# Patient Record
Sex: Female | Born: 1995 | Race: White | Hispanic: No | Marital: Single | Smoking: Never smoker
Health system: Southern US, Community
[De-identification: ages and names within clinical notes are randomized; demographics above are authoritative.]

## PROBLEM LIST (undated history)

## (undated) HISTORY — PX: BACK SURGERY: SHX140

---

## 2020-05-01 ENCOUNTER — Ambulatory Visit (INDEPENDENT_AMBULATORY_CARE_PROVIDER_SITE_OTHER): Payer: BC Managed Care – PPO | Admitting: Medical-Surgical

## 2020-05-01 ENCOUNTER — Other Ambulatory Visit: Payer: Self-pay

## 2020-05-01 ENCOUNTER — Encounter: Payer: Self-pay | Admitting: Medical-Surgical

## 2020-05-01 VITALS — BP 118/77 | HR 74 | Temp 97.5°F | Ht 65.5 in | Wt 234.0 lb

## 2020-05-01 DIAGNOSIS — F418 Other specified anxiety disorders: Secondary | ICD-10-CM | POA: Diagnosis not present

## 2020-05-01 DIAGNOSIS — R234 Changes in skin texture: Secondary | ICD-10-CM

## 2020-05-01 DIAGNOSIS — R358 Other polyuria: Secondary | ICD-10-CM

## 2020-05-01 DIAGNOSIS — G479 Sleep disorder, unspecified: Secondary | ICD-10-CM

## 2020-05-01 DIAGNOSIS — R631 Polydipsia: Secondary | ICD-10-CM

## 2020-05-01 DIAGNOSIS — R5383 Other fatigue: Secondary | ICD-10-CM

## 2020-05-01 DIAGNOSIS — N6325 Unspecified lump in the left breast, overlapping quadrants: Secondary | ICD-10-CM

## 2020-05-01 DIAGNOSIS — R3589 Other polyuria: Secondary | ICD-10-CM

## 2020-05-01 MED ORDER — QUETIAPINE FUMARATE 50 MG PO TABS
50.0000 mg | ORAL_TABLET | Freq: Every day | ORAL | 1 refills | Status: DC
Start: 2020-05-01 — End: 2020-05-21

## 2020-05-01 NOTE — Progress Notes (Signed)
New Patient Office Visit  Subjective:  Patient ID: Sheryl [name], female    DOB: 17-May-1996  Age: 24 y.o. MRN: [id number]  CC:  Chief Complaint  Patient presents with  . Establish Care    HPI Sheryl [name] presents to establish care.  Depression/anxiety-reports she has both depression and anxiety.  It comes in waves with no specific trigger for either.  She has been more aware of her symptoms since she started school for physical therapy.  She goes from highs to lows, changing approximately every 3 to 4 weeks.  She does note that this seems to be somewhat related to her menstrual cycles as she will have a "high" the week prior to starting her period, changing to more depressed the day before she starts bleeding.  During her high periods, she experiences improved self-esteem, increased talkativeness, increased goal-directed activities, decreased sleep, and racing thoughts.  During her low periods, she experiences fatigue, difficulty concentrating, difficulty following through on activities, and decreased interest in activities.  She did do some counseling in 2015 through 2018 and again last fall.  She feels that counseling was helpful and taught her coping skills.  She did take medication for treatment for about 6 months in 2015 but she is not sure which one it was.  That medication made her more sleepy than anything.  Denies SI/HI.  She is very interested in getting started on medication that will help her stabilize her mood as well as improve her sleep quality.  Breast concerns-she has a very strong family history of breast cancer in a woman in both sides of her family.  She is concerned about breast cancer prevention and breast changes.  She notes that she does have lumps that occasionally develop in her breasts which then popped through the skin, becoming a sore before scabbing over and healing.  She notes that she does do self breast exams monthly and she tries to do them the week after her  period.  Obesity-over the past 18 months she has lost 50 pounds. She is very cognizant that obesity will cause health problems and she is very worried about diabetes as this is also strong in her family history.  She endorses polydipsia and polyuria.  Notes that she does feel hungry frequently but is unable to eat much food.  Notes that over the past few months her weight has stayed the same but her clothes are starting to fit her better since she is focusing on eating healthier and getting more activity.  History reviewed. No pertinent past medical history.  Past Surgical History:  Procedure Laterality Date  . BACK SURGERY     L4-L5 fusion    Family History  Problem Relation Age of Onset  . Diabetes Mother   . Breast cancer Maternal Grandmother   . Hypertension Maternal Grandfather   . Leukemia Maternal Grandfather   . Breast cancer Paternal Grandmother   . Diabetes Paternal Grandfather     Social History   Socioeconomic History  . Marital status: Married    Spouse name: Not on file  . Number of children: Not on file  . Years of education: Not on file  . Highest education level: Not on file  Occupational History  . Not on file  Tobacco Use  . Smoking status: Never Smoker  . Smokeless tobacco: Never Used  Vaping Use  . Vaping Use: Never used  Substance and Sexual Activity  . Alcohol use: Yes    Comment: Occasionally  .  Drug use: Never  . Sexual activity: Never  Other Topics Concern  . Not on file  Social History Narrative  . Not on file   Social Determinants of Health   Financial Resource Strain:   . Difficulty of Paying Living Expenses:   Food Insecurity:   . Worried About Programme researcher, broadcasting/film/video in the Last Year:   . Barista in the Last Year:   Transportation Needs:   . Freight forwarder (Medical):   Marland Kitchen Lack of Transportation (Non-Medical):   Physical Activity:   . Days of Exercise per Week:   . Minutes of Exercise per Session:   Stress:   .  Feeling of Stress :   Social Connections:   . Frequency of Communication with Friends and Family:   . Frequency of Social Gatherings with Friends and Family:   . Attends Religious Services:   . Active Member of Clubs or Organizations:   . Attends Banker Meetings:   Marland Kitchen Marital Status:   Intimate Partner Violence:   . Fear of Current or Ex-Partner:   . Emotionally Abused:   Marland Kitchen Physically Abused:   . Sexually Abused:     ROS Review of Systems  Constitutional: Positive for fatigue. Negative for chills and fever.  Eyes: Negative for visual disturbance.  Respiratory: Negative for cough, chest tightness, shortness of breath and wheezing.   Cardiovascular: Negative for chest pain, palpitations and leg swelling.  Gastrointestinal: Positive for abdominal pain, constipation and nausea. Negative for diarrhea.  Endocrine: Positive for polydipsia and polyuria. Negative for cold intolerance, heat intolerance and polyphagia.  Genitourinary: Positive for urgency. Negative for dysuria, frequency, hematuria, vaginal bleeding and vaginal discharge.  Allergic/Immunologic: Positive for food allergies ([place]).  Neurological: Positive for light-headedness and headaches. Negative for dizziness and numbness.  Hematological: Does not bruise/bleed easily.  Psychiatric/Behavioral: Positive for decreased concentration, dysphoric mood and sleep disturbance. Negative for self-injury and suicidal ideas. The patient is nervous/anxious.    Depression screen PHQ 2/9 05/01/2020  Decreased Interest 2  Down, Depressed, Hopeless 3  PHQ - 2 Score 5  Altered sleeping 3  Tired, decreased energy 2  Change in appetite 2  Feeling bad or failure about yourself  3  Trouble concentrating 2  Moving slowly or fidgety/restless 3  Suicidal thoughts 0  PHQ-9 Score 20  Difficult doing work/chores Somewhat difficult   GAD 7 : Generalized Anxiety Score 05/01/2020  Nervous, Anxious, on Edge 2  Control/stop worrying 3   Worry too much - different things 3  Trouble relaxing 3  Restless 2  Easily annoyed or irritable 2  Afraid - awful might happen 3  Total GAD 7 Score 18  Anxiety Difficulty Somewhat difficult   Objective:   Today's Vitals: BP 118/77   Pulse 74   Temp (!) 97.5 F (36.4 C) (Oral)   Ht 5' 5.5" (1.664 m)   Wt 234 lb (106.1 kg)   LMP 04/24/2020   SpO2 99%   BMI 38.35 kg/m   Physical Exam Vitals reviewed.  Constitutional:      General: She is not in acute distress.    Appearance: Normal appearance.  HENT:     Head: Normocephalic and atraumatic.  Neck:     Thyroid: No thyromegaly.  Cardiovascular:     Rate and Rhythm: Normal rate and regular rhythm.     Pulses: Normal pulses.     Heart sounds: Normal heart sounds. No murmur heard.  No friction rub. No gallop.  Pulmonary:     Effort: Pulmonary effort is normal. No respiratory distress.     Breath sounds: Normal breath sounds. No wheezing.  Chest:     Breasts:        Right: Skin change present.        Left: Skin change present.       Comments: Small superficial lump at the 3 o'clock position approximately 4 cm from the nipple, less than 1 cm in size. Abdominal:     General: Bowel sounds are normal. There is no distension.     Palpations: Abdomen is soft. There is no mass.     Tenderness: There is no abdominal tenderness. There is no guarding or rebound.     Hernia: No hernia is present.  Skin:    General: Skin is warm and dry.  Neurological:     Mental Status: She is alert and oriented to person, place, and time.  Psychiatric:        Mood and Affect: Mood normal.        Behavior: Behavior normal.        Thought Content: Thought content normal.        Judgment: Judgment normal.     Assessment & Plan:   1. Depression with anxiety Discussed treatment via PCP versus via psychiatry.  Patient would like to proceed with treatment with PCP for now.  She is open to referral to psychiatry at a later time if necessary.   Symptoms highly suggestive of bipolar disorder.  Checking blood work today.  Starting Seroquel 50 mg nightly at bedtime.  Advised patient to evaluate for any side effects or oversedation.  If she is too sedated on 50 mg nightly, okay to reduce to 25 mg nightly at bedtime.  - CBC - COMPLETE METABOLIC PANEL WITH GFR - Hemoglobin A1c - TSH  2. Fatigue, unspecified type Checking CBC, CMP, hemoglobin A1c, and TSH today. - CBC - COMPLETE METABOLIC PANEL WITH GFR - Hemoglobin A1c - TSH  3. Trouble in sleeping Starting Seroquel 50 mg nightly as needed.  Recommend establishing a bedtime routine and following good sleep hygiene measures.  4. Polydipsia/polyuria Checking hemoglobin A1c.  6. Breast skin changes/breast lump on left side at 3 o'clock position With skin changes and lump identified, we will go ahead and get ultrasound of bilateral breasts. - US BREAST COMPLETE UNI LEFT INC AXILLA; Future - US BREAST COMPLETE UNI RIGHT INC AXILLA; Future    Outpatient Encounter Medications as of 05/01/2020  Medication Sig  . QUEtiapine (SEROQUEL) 50 MG tablet Take 1 tablet (50 mg total) by mouth at bedtime.   No facility-administered encounter medications on file as of 05/01/2020.    Follow-up: Return in about 6 weeks (around 06/12/2020) for mood follow up.   Clearnce Sorrel, DNP, APRN, FNP-BC [facility]

## 2020-05-02 LAB — COMPLETE METABOLIC PANEL WITH GFR
AG Ratio: 1.9 (calc) (ref 1.0–2.5)
ALT: 13 U/L (ref 6–29)
AST: 14 U/L (ref 10–30)
Albumin: 4.4 g/dL (ref 3.6–5.1)
Alkaline phosphatase (APISO): 58 U/L (ref 31–125)
BUN: 14 mg/dL (ref 7–25)
CO2: 29 mmol/L (ref 20–32)
Calcium: 9.7 mg/dL (ref 8.6–10.2)
Chloride: 104 mmol/L (ref 98–110)
Creat: 0.77 mg/dL (ref 0.50–1.10)
GFR, Est African American: 125 mL/min/{1.73_m2} (ref >=60)
GFR, Est Non African American: 108 mL/min/{1.73_m2} (ref >=60)
Globulin: 2.3 g/dL (calc) (ref 1.9–3.7)
Glucose, Bld: 98 mg/dL (ref 65–139)
Potassium: 4.2 mmol/L (ref 3.5–5.3)
Sodium: 139 mmol/L (ref 135–146)
Total Bilirubin: 0.4 mg/dL (ref 0.2–1.2)
Total Protein: 6.7 g/dL (ref 6.1–8.1)

## 2020-05-02 LAB — CBC
HCT: 40.6 % (ref 35.0–45.0)
Hemoglobin: 13.7 g/dL (ref 11.7–15.5)
MCH: 29 pg (ref 27.0–33.0)
MCHC: 33.7 g/dL (ref 32.0–36.0)
MCV: 86 fL (ref 80.0–100.0)
MPV: 10.9 fL (ref 7.5–12.5)
Platelets: 310 10*3/uL (ref 140–400)
RBC: 4.72 10*6/uL (ref 3.80–5.10)
RDW: 13.4 % (ref 11.0–15.0)
WBC: 7.8 10*3/uL (ref 3.8–10.8)

## 2020-05-02 LAB — HEMOGLOBIN A1C
Hgb A1c MFr Bld: 5.6 % of total Hgb (ref <5.7)
Mean Plasma Glucose: 114 (calc)
eAG (mmol/L): 6.3 (calc)

## 2020-05-02 LAB — TSH: TSH: 0.75 mIU/L

## 2020-05-20 ENCOUNTER — Encounter: Payer: Self-pay | Admitting: Medical-Surgical

## 2020-05-21 ENCOUNTER — Other Ambulatory Visit: Payer: Self-pay | Admitting: Medical-Surgical

## 2020-05-21 MED ORDER — QUETIAPINE FUMARATE 50 MG PO TABS: 50.0000 mg | ORAL_TABLET | Freq: Every day | ORAL | 1 refills | Status: DC

## 2020-05-22 ENCOUNTER — Other Ambulatory Visit: Payer: BC Managed Care – PPO

## 2020-06-05 ENCOUNTER — Ambulatory Visit: Payer: BC Managed Care – PPO | Admitting: Medical-Surgical

## 2020-11-18 DIAGNOSIS — J019 Acute sinusitis, unspecified: Secondary | ICD-10-CM | POA: Diagnosis not present

## 2020-11-18 DIAGNOSIS — J069 Acute upper respiratory infection, unspecified: Secondary | ICD-10-CM | POA: Diagnosis not present

## 2021-01-08 ENCOUNTER — Other Ambulatory Visit: Payer: Self-pay

## 2021-01-08 ENCOUNTER — Ambulatory Visit: Payer: BC Managed Care – PPO | Admitting: Medical-Surgical

## 2021-01-08 ENCOUNTER — Ambulatory Visit (INDEPENDENT_AMBULATORY_CARE_PROVIDER_SITE_OTHER): Payer: BC Managed Care – PPO

## 2021-01-08 ENCOUNTER — Encounter: Payer: Self-pay | Admitting: Medical-Surgical

## 2021-01-08 VITALS — BP 115/76 | HR 68 | Temp 98.4°F | Ht 65.5 in | Wt 240.8 lb

## 2021-01-08 DIAGNOSIS — M79642 Pain in left hand: Secondary | ICD-10-CM

## 2021-01-08 DIAGNOSIS — F418 Other specified anxiety disorders: Secondary | ICD-10-CM

## 2021-01-08 DIAGNOSIS — M79645 Pain in left finger(s): Secondary | ICD-10-CM

## 2021-01-08 MED ORDER — QUETIAPINE FUMARATE 50 MG PO TABS: 75.0000 mg | ORAL_TABLET | Freq: Every day | ORAL | 1 refills | Status: DC

## 2021-01-08 NOTE — Progress Notes (Signed)
Subjective:    CC: mood follow up, left hand pain  HPI: Pleasant 25 year old female presenting for mood follow up. Has been taking Seroquel 50mg  nightly, tolerating well after a period of adjustment. No side effects at current dose. Feels like she is a bit better with her overall mood and is sleeping some better than she did previously. Does feel that her mood could be better. Denies SI/HI.   Has been having left hand pain along the base of her thumb for 6-7 months. Pain worsens and improves but never resolves. No known injury or trauma. Pain worse with adduction of the thumb. Taking occasional ibuprofen if pain is severe, some relief. Works as a .   I reviewed the past medical history, family history, social history, surgical history, and allergies today and no changes were needed.  Please see the problem list section below in epic for further details.  Past Medical History: History reviewed. No pertinent past medical history. Past Surgical History: Past Surgical History:  Procedure Laterality Date  . BACK SURGERY     L4-L5 fusion   Social History: Social History   Socioeconomic History  . Marital status: Married    Spouse name: Not on file  . Number of children: Not on file  . Years of education: Not on file  . Highest education level: Not on file  Occupational History  . Not on file  Tobacco Use  . Smoking status: Never Smoker  . Smokeless tobacco: Never Used  Vaping Use  . Vaping Use: Never used  Substance and Sexual Activity  . Alcohol use: Yes    Comment: Occasionally  . Drug use: Never  . Sexual activity: Never  Other Topics Concern  . Not on file  Social History Narrative  . Not on file   Social Determinants of Health   Financial Resource Strain: Not on file  Food Insecurity: Not on file  Transportation Needs: Not on file  Physical Activity: Not on file  Stress: Not on file  Social Connections: Not on file   Family History: Family History   Problem Relation Age of Onset  . Diabetes Mother   . Breast cancer Maternal Grandmother   . Hypertension Maternal Grandfather   . Leukemia Maternal Grandfather   . Breast cancer Paternal Grandmother   . Diabetes Paternal Grandfather    Allergies: Allergies  Allergen Reactions  . Codeine     Childhood reaction   Medications: See med rec.  Review of Systems: See HPI for pertinent positives and negatives.   Depression screen [facility] 2/9 01/08/2021 05/01/2020  Decreased Interest 1 2  Down, Depressed, Hopeless 1 3  PHQ - 2 Score 2 5  Altered sleeping 2 3  Tired, decreased energy 1 2  Change in appetite 2 2  Feeling bad or failure about yourself  2 3  Trouble concentrating 2 2  Moving slowly or fidgety/restless 1 3  Suicidal thoughts 0 0  PHQ-9 Score 12 20  Difficult doing work/chores Somewhat difficult Somewhat difficult   GAD 7 : Generalized Anxiety Score 01/08/2021 05/01/2020  Nervous, Anxious, on Edge 2 2  Control/stop worrying 2 3  Worry too much - different things 2 3  Trouble relaxing 2 3  Restless 2 2  Easily annoyed or irritable 2 2  Afraid - awful might happen 2 3  Total GAD 7 Score 14 18  Anxiety Difficulty Somewhat difficult Somewhat difficult   Objective:    General: Well Developed, well nourished, and in no  acute distress.  Neuro: Alert and oriented x3.  HEENT: Normocephalic, atraumatic.  Skin: Warm and dry. Cardiac: Regular rate and rhythm, no murmurs rubs or gallops, no lower extremity edema.  Respiratory: Clear to auscultation bilaterally. Not using accessory muscles, speaking in full sentences.  Impression and Recommendations:    1. Depression with anxiety Options include initiating counseling, increasing Seroquel, or adding a SSRI. Patient would like to try increasing the Seroquel so going up to 75mg  nightly. Recommend counseling but she would like to think about it. Advised to try [employer] since she's a employee.   2. 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 Recommend ibuprofen 800mg  every 8 hours as needed but would do regular dosing for at least 7 to 10 days for maximum benefit. Rehab and strengthening exercise daily. If no improvement after 4 weeks, return for further evaluation with Dr. [employer].  - DG Hand Complete Left; Future  Return in about 4 weeks (around 02/05/2021) for mood follow up (ok to be virtual). ___________________________________________ Karie Schwalbe, DNP, APRN, FNP-BC Primary [facility]

## 2021-02-05 ENCOUNTER — Encounter: Payer: Self-pay | Admitting: Medical-Surgical

## 2021-02-05 ENCOUNTER — Ambulatory Visit: Payer: BC Managed Care – PPO | Admitting: Medical-Surgical

## 2021-02-05 ENCOUNTER — Other Ambulatory Visit: Payer: Self-pay

## 2021-02-05 VITALS — BP 104/70 | HR 69 | Temp 97.8°F | Ht 65.5 in | Wt 236.8 lb

## 2021-02-05 DIAGNOSIS — F418 Other specified anxiety disorders: Secondary | ICD-10-CM | POA: Diagnosis not present

## 2021-02-05 MED ORDER — ESCITALOPRAM OXALATE 10 MG PO TABS
ORAL_TABLET | ORAL | 1 refills | Status: DC
Start: 2021-02-05 — End: 2021-04-06

## 2021-02-05 MED ORDER — QUETIAPINE FUMARATE 50 MG PO TABS: 50.0000 mg | ORAL_TABLET | Freq: Every day | ORAL | 1 refills | Status: DC

## 2021-02-05 NOTE — Progress Notes (Signed)
Subjective:    CC: Mood follow-up  HPI: Pleasant 25 year old female presenting today for follow-up on her mood.  At her last visit approximately 4 weeks ago, we increased her Seroquel from 50 mg nightly to 75 mg nightly.  She has tolerated the increase well but has developed some excessive grogginess in the mornings.  She does find herself still being somewhat irritable and restless and is frustrated that she has difficulty concentrating.  Outside of the grogginess, her biggest concern is depression.  Notes that she is able to calm herself rather quickly when needed but when she gets in a slump, she has a hard time pulling herself out of it.  Denies SI/HI.  I reviewed the past medical history, family history, social history, surgical history, and allergies today and no changes were needed.  Please see the problem list section below in epic for further details.  Past Medical History: History reviewed. No pertinent past medical history. Past Surgical History: Past Surgical History:  Procedure Laterality Date  . BACK SURGERY     L4-L5 fusion   Social History: Social History   Socioeconomic History  . Marital status: Married    Spouse name: Not on file  . Number of children: Not on file  . Years of education: Not on file  . Highest education level: Not on file  Occupational History  . Not on file  Tobacco Use  . Smoking status: Never Smoker  . Smokeless tobacco: Never Used  Vaping Use  . Vaping Use: Never used  Substance and Sexual Activity  . Alcohol use: Yes    Comment: Occasionally  . Drug use: Never  . Sexual activity: Never  Other Topics Concern  . Not on file  Social History Narrative  . Not on file   Social Determinants of Health   Financial Resource Strain: Not on file  Food Insecurity: Not on file  Transportation Needs: Not on file  Physical Activity: Not on file  Stress: Not on file  Social Connections: Not on file   Family History: Family History   Problem Relation Age of Onset  . Diabetes Mother   . Breast cancer Maternal Grandmother   . Hypertension Maternal Grandfather   . Leukemia Maternal Grandfather   . Breast cancer Paternal Grandmother   . Diabetes Paternal Grandfather    Allergies: Allergies  Allergen Reactions  . Codeine     Childhood reaction   Medications: See med rec.  Review of Systems: See HPI for pertinent positives and negatives.   Depression screen [facility] 2/9 02/05/2021 01/08/2021 05/01/2020  Decreased Interest 1 1 2   Down, Depressed, Hopeless 1 1 3   PHQ - 2 Score 2 2 5   Altered sleeping 2 2 3   Tired, decreased energy 2 1 2   Change in appetite 1 2 2   Feeling bad or failure about yourself  1 2 3   Trouble concentrating 2 2 2   Moving slowly or fidgety/restless 2 1 3   Suicidal thoughts 0 0 0  PHQ-9 Score 12 12 20   Difficult doing work/chores Somewhat difficult Somewhat difficult Somewhat difficult   GAD 7 : Generalized Anxiety Score 02/05/2021 01/08/2021 05/01/2020  Nervous, Anxious, on Edge 2 2 2   Control/stop worrying 1 2 3   Worry too much - different things 2 2 3   Trouble relaxing 2 2 3   Restless 3 2 2   Easily annoyed or irritable 1 2 2   Afraid - awful might happen 1 2 3   Total GAD 7 Score 12 14 18  Anxiety Difficulty Somewhat difficult Somewhat difficult Somewhat difficult     Objective:    General: Well Developed, well nourished, and in no acute distress.  Neuro: Alert and oriented x3.  HEENT: Normocephalic, atraumatic.  Skin: Warm and dry. Cardiac: Regular rate and rhythm, no murmurs rubs or gallops, no lower extremity edema.  Respiratory: Clear to auscultation bilaterally. Not using accessory muscles, speaking in full sentences.  Impression and Recommendations:    1. Depression with anxiety Reducing Seroquel to 50 mg nightly due to excessive grogginess in the morning.  Adding Lexapro 5 mg for the first 8 days then increase to 10 mg daily.  Reviewed possible side effects and expectations for  efficiency of the new medication.  Advised patient to contact us should she experience a worsening of her symptoms while on the new medication.  Return in about 4 weeks (around 03/05/2021) for mood follow up. ___________________________________________ Thayer Ohm, DNP, APRN, FNP-BC Primary [facility] Sports [facility] [place]

## 2021-03-05 ENCOUNTER — Other Ambulatory Visit (HOSPITAL_COMMUNITY)
Admission: RE | Admit: 2021-03-05 | Discharge: 2021-03-05 | Disposition: A | Discharge status: Home / Self Care | Payer: BC Managed Care – PPO | Source: Ambulatory Visit | Attending: Medical-Surgical | Admitting: Medical-Surgical

## 2021-03-05 ENCOUNTER — Ambulatory Visit (INDEPENDENT_AMBULATORY_CARE_PROVIDER_SITE_OTHER): Payer: BC Managed Care – PPO | Admitting: Medical-Surgical

## 2021-03-05 ENCOUNTER — Encounter: Payer: BC Managed Care – PPO | Admitting: Medical-Surgical

## 2021-03-05 ENCOUNTER — Encounter: Payer: Self-pay | Admitting: Medical-Surgical

## 2021-03-05 VITALS — BP 99/65 | HR 72 | Temp 98.2°F | Ht 65.5 in | Wt 238.7 lb

## 2021-03-05 DIAGNOSIS — Z0000 Encounter for general adult medical examination without abnormal findings: Secondary | ICD-10-CM

## 2021-03-05 DIAGNOSIS — Z114 Encounter for screening for human immunodeficiency virus [HIV]: Secondary | ICD-10-CM

## 2021-03-05 DIAGNOSIS — Z124 Encounter for screening for malignant neoplasm of cervix: Secondary | ICD-10-CM

## 2021-03-05 DIAGNOSIS — Z1329 Encounter for screening for other suspected endocrine disorder: Secondary | ICD-10-CM | POA: Diagnosis not present

## 2021-03-05 DIAGNOSIS — F418 Other specified anxiety disorders: Secondary | ICD-10-CM | POA: Diagnosis not present

## 2021-03-05 DIAGNOSIS — Z1159 Encounter for screening for other viral diseases: Secondary | ICD-10-CM

## 2021-03-05 NOTE — Patient Instructions (Signed)
Preventive Care 21-26 Years Old, Female Preventive care refers to lifestyle choices and visits with your health care provider that can promote health and wellness. This includes:  A yearly physical exam. This is also called an annual wellness visit.  Regular dental and eye exams.  Immunizations.  Screening for certain conditions.  Healthy lifestyle choices, such as: ? Eating a healthy diet. ? Getting regular exercise. ? Not using drugs or products that contain nicotine and tobacco. ? Limiting alcohol use. What can I expect for my preventive care visit? Physical exam Your health care provider may check your:  Height and weight. These may be used to calculate your BMI (body mass index). BMI is a measurement that tells if you are at a healthy weight.  Heart rate and blood pressure.  Body temperature.  Skin for abnormal spots. Counseling Your health care provider may ask you questions about your:  Past medical problems.  Family's medical history.  Alcohol, tobacco, and drug use.  Emotional well-being.  Home life and relationship well-being.  Sexual activity.  Diet, exercise, and sleep habits.  Work and work environment.  Access to firearms.  Method of birth control.  Menstrual cycle.  Pregnancy history. What immunizations do I need? Vaccines are usually given at various ages, according to a schedule. Your health care provider will recommend vaccines for you based on your age, medical history, and lifestyle or other factors, such as travel or where you work.   What tests do I need? Blood tests  Lipid and cholesterol levels. These may be checked every 5 years starting at age 20.  Hepatitis C test.  Hepatitis B test. Screening  Diabetes screening. This is done by checking your blood sugar (glucose) after you have not eaten for a while (fasting).  STD (sexually transmitted disease) testing, if you are at risk.  BRCA-related cancer screening. This may be  done if you have a family history of breast, ovarian, tubal, or peritoneal cancers.  Pelvic exam and Pap test. This may be done every 3 years starting at age 21. Starting at age 30, this may be done every 5 years if you have a Pap test in combination with an HPV test. Talk with your health care provider about your test results, treatment options, and if necessary, the need for more tests.   Follow these instructions at home: Eating and drinking  Eat a healthy diet that includes fresh fruits and vegetables, whole grains, lean protein, and low-fat dairy products.  Take vitamin and mineral supplements as recommended by your health care provider.  Do not drink alcohol if: ? Your health care provider tells you not to drink. ? You are pregnant, may be pregnant, or are planning to become pregnant.  If you drink alcohol: ? Limit how much you have to 0-1 drink a day. ? Be aware of how much alcohol is in your drink. In the [place] one drink equals one 12 oz bottle of beer (355 mL), one 5 oz glass of wine (148 mL), or one 1 oz glass of hard liquor (44 mL).   Lifestyle  Take daily care of your teeth and gums. Brush your teeth every morning and night with fluoride toothpaste. Floss one time each day.  Stay active. Exercise for at least 30 minutes 5 or more days each week.  Do not use any products that contain nicotine or tobacco, such as cigarettes, e-cigarettes, and chewing tobacco. If you need help quitting, ask your health care provider.  Do not   use drugs.  If you are sexually active, practice safe sex. Use a condom or other form of protection to prevent STIs (sexually transmitted infections).  If you do not wish to become pregnant, use a form of birth control. If you plan to become pregnant, see your health care provider for a prepregnancy visit.  Find healthy ways to cope with stress, such as: ? Meditation, yoga, or listening to music. ? Journaling. ? Talking to a trusted  person. ? Spending time with friends and family. Safety  Always wear your seat belt while driving or riding in a vehicle.  Do not drive: ? If you have been drinking alcohol. Do not ride with someone who has been drinking. ? When you are tired or distracted. ? While texting.  Wear a helmet and other protective equipment during sports activities.  If you have firearms in your house, make sure you follow all gun safety procedures.  Seek help if you have been physically or sexually abused. What's next?  Go to your health care provider once a year for an annual wellness visit.  Ask your health care provider how often you should have your eyes and teeth checked.  Stay up to date on all vaccines. This information is not intended to replace advice given to you by your health care provider. Make sure you discuss any questions you have with your health care provider. Document Revised: 06/21/2020 Document Reviewed: 07/05/2018 Elsevier Patient Education  2021 [employer]  

## 2021-03-05 NOTE — Progress Notes (Signed)
HPI: Sheryl [name] is a 25 y.o. female who  has no past medical history on file.  she presents to [facility] today, 03/05/21,  for chief complaint of: Annual physical exam  Dentist: every 6 months, UTD, no concerns Eye exam: never had an official eye exam, no concerns, no correction Exercise: Walking at least 20 minutes a day with some short 10 minute workouts included Diet: working on making healthier choices Pap smear: never done, completing today COVID vaccine: done, no booster  Concerns:  Seroquel is too sedating and leaves her groggy. Would like to try something different to help with mood swings/irritability. Lexapro still helpful.  Past medical, surgical, social and family history reviewed:  There are no problems to display for this patient.   Past Surgical History:  Procedure Laterality Date  . BACK SURGERY     L4-L5 fusion    Social History   Tobacco Use  . Smoking status: Never Smoker  . Smokeless tobacco: Never Used  Substance Use Topics  . Alcohol use: Yes    Comment: Occasionally    Family History  Problem Relation Age of Onset  . Diabetes Mother   . Breast cancer Maternal Grandmother   . Hypertension Maternal Grandfather   . Leukemia Maternal Grandfather   . Breast cancer Paternal Grandmother   . Diabetes Paternal Grandfather      Current medication list and allergy/intolerance information reviewed:    Current Outpatient Medications  Medication Sig Dispense Refill  . escitalopram (LEXAPRO) 10 MG tablet Take 0.5 tablets (5 mg total) by mouth daily for 8 days, THEN 1 tablet (10 mg total) daily for 22 days. 30 tablet 1  . QUEtiapine (SEROQUEL) 50 MG tablet Take 1 tablet (50 mg total) by mouth at bedtime. 90 tablet 1   No current facility-administered medications for this visit.    Allergies  Allergen Reactions  . Codeine     Childhood reaction      Review of Systems:  Constitutional:  No  fever, no  chills, No recent illness, No unintentional weight changes. No significant fatigue.   HEENT: No  headache, no vision change, no hearing change, No sore throat, No  sinus pressure  Cardiac: No  chest pain, No  pressure, No palpitations, No  Orthopnea  Respiratory:  No  shortness of breath. No  Cough  Gastrointestinal: No  abdominal pain, No  nausea, No  vomiting,  No  blood in stool, No  diarrhea, No  constipation   Musculoskeletal: No new myalgia/arthralgia  Skin: No  Rash, No other wounds/concerning lesions  Genitourinary: No  incontinence, No  abnormal genital bleeding, No abnormal genital discharge  Hem/Onc: No  easy bruising/bleeding, No  abnormal lymph node  Endocrine: No cold intolerance,  No heat intolerance. No polyuria/polydipsia/polyphagia   Neurologic: No  weakness, No  dizziness, No  slurred speech/focal weakness/facial droop  Psychiatric: No  concerns with depression, No  concerns with anxiety, No sleep problems, + mood problems  Exam:  BP 99/65   Pulse 72   Temp 98.2 F (36.8 C)   Ht 5' 5.5" (1.664 m)   Wt 238 lb 11.2 oz (108.3 kg)   LMP 02/15/2021   SpO2 98%   BMI 39.12 kg/m   Constitutional: VS see above. General Appearance: alert, well-developed, well-nourished, NAD  Eyes: Normal lids and conjunctive, non-icteric sclera  Ears, Nose, Mouth, Throat: MMM, Normal external inspection ears/nares/mouth/lips/gums. TM normal bilaterally.    Neck: No masses, trachea midline. No  thyroid enlargement. No tenderness/mass appreciated. No lymphadenopathy  Respiratory: Normal respiratory effort. no wheeze, no rhonchi, no rales  Cardiovascular: S1/S2 normal, no murmur, no rub/gallop auscultated. RRR. No lower extremity edema. Pedal pulse II/IV bilaterally PT. No carotid bruit or JVD. No abdominal aortic bruit.  Gastrointestinal: Nontender, no masses. No hepatomegaly, no splenomegaly. No hernia appreciated. Bowel sounds normal. Rectal exam deferred.   Musculoskeletal:  Gait normal. No clubbing/cyanosis of digits.   Neurological: Normal balance/coordination. No tremor. No cranial nerve deficit on limited exam. Motor and sensation intact and symmetric. Cerebellar reflexes intact.   Skin: warm, dry, intact. No rash/ulcer. No concerning nevi or subq nodules on limited exam.    Psychiatric: Normal judgment/insight. Normal mood and affect. Oriented x3.   Pelvic exam: normal external genitalia, vulva, vagina, cervix, uterus and adnexa, PAP: Pap smear done today, exam chaperoned by Glennie Hawk, MA.   No results found for this or any previous visit (from the past 72 hour(s)).  No results found.   ASSESSMENT/PLAN:   1. Annual physical exam Checking CBC w/diff, CMP, and lipid panel today.  - CBC with Differential/Platelet - COMPLETE METABOLIC PANEL WITH GFR - Lipid panel  2. Depression with anxiety Continue Lexapro. Discontinue Seroquel. Starting Lamictal 37m daily for 7 days then increase to 538mdaily.   3. Screening for cervical cancer Pap smear completed.  - Cytology - PAP  4. Screening for endocrine disorder Checking TSH. - TSH  5. Encounter for screening for HIV 6. Need for hepatitis C screening test Discussed screening recommendations. Adding to blood work today.  - HIV Antibody (routine testing w rflx) - Hepatitis C antibody  Orders Placed This Encounter  Procedures  . CBC with Differential/Platelet  . COMPLETE METABOLIC PANEL WITH GFR  . Lipid panel  . TSH  . HIV Antibody (routine testing w rflx)  . Hepatitis C antibody    No orders of the defined types were placed in this encounter.   Patient Instructions   Preventive Care [id number] Old, Female Preventive care refers to lifestyle choices and visits with your health care provider that can promote health and wellness. This includes:  A yearly physical exam. This is also called an annual wellness visit.  Regular dental and eye exams.  Immunizations.  Screening for  certain conditions.  Healthy lifestyle choices, such as: ? Eating a healthy diet. ? Getting regular exercise. ? Not using drugs or products that contain nicotine and tobacco. ? Limiting alcohol use. What can I expect for my preventive care visit? Physical exam Your health care provider may check your:  Height and weight. These may be used to calculate your BMI (body mass index). BMI is a measurement that tells if you are at a healthy weight.  Heart rate and blood pressure.  Body temperature.  Skin for abnormal spots. Counseling Your health care provider may ask you questions about your:  Past medical problems.  Family's medical history.  Alcohol, tobacco, and drug use.  Emotional well-being.  Home life and relationship well-being.  Sexual activity.  Diet, exercise, and sleep habits.  Work and work enStatistician Access to firearms.  Method of birth control.  Menstrual cycle.  Pregnancy history. What immunizations do I need? Vaccines are usually given at various ages, according to a schedule. Your health care provider will recommend vaccines for you based on your age, medical history, and lifestyle or other factors, such as travel or where you work.   What tests do I need? Blood tests  Lipid and cholesterol levels. These may be checked every 5 years starting at age 26.  Hepatitis C test.  Hepatitis B test. Screening  Diabetes screening. This is done by checking your blood sugar (glucose) after you have not eaten for a while (fasting).  STD (sexually transmitted disease) testing, if you are at risk.  BRCA-related cancer screening. This may be done if you have a family history of breast, ovarian, tubal, or peritoneal cancers.  Pelvic exam and Pap test. This may be done every 3 years starting at age 66. Starting at age 26, this may be done every 5 years if you have a Pap test in combination with an HPV test. Talk with your health care provider about your test  results, treatment options, and if necessary, the need for more tests.   Follow these instructions at home: Eating and drinking  Eat a healthy diet that includes fresh fruits and vegetables, whole grains, lean protein, and low-fat dairy products.  Take vitamin and mineral supplements as recommended by your health care provider.  Do not drink alcohol if: ? Your health care provider tells you not to drink. ? You are pregnant, may be pregnant, or are planning to become pregnant.  If you drink alcohol: ? Limit how much you have to 0-1 drink a day. ? Be aware of how much alcohol is in your drink. In the [place] one drink equals one 12 oz bottle of beer (355 mL), one 5 oz glass of wine (148 mL), or one 1 oz glass of hard liquor (44 mL).   Lifestyle  Take daily care of your teeth and gums. Brush your teeth every morning and night with fluoride toothpaste. Floss one time each day.  Stay active. Exercise for at least 30 minutes 5 or more days each week.  Do not use any products that contain nicotine or tobacco, such as cigarettes, e-cigarettes, and chewing tobacco. If you need help quitting, ask your health care provider.  Do not use drugs.  If you are sexually active, practice safe sex. Use a condom or other form of protection to prevent STIs (sexually transmitted infections).  If you do not wish to become pregnant, use a form of birth control. If you plan to become pregnant, see your health care provider for a prepregnancy visit.  Find healthy ways to cope with stress, such as: ? Meditation, yoga, or listening to music. ? Journaling. ? Talking to a trusted person. ? Spending time with friends and family. Safety  Always wear your seat belt while driving or riding in a vehicle.  Do not drive: ? If you have been drinking alcohol. Do not ride with someone who has been drinking. ? When you are tired or distracted. ? While texting.  Wear a helmet and other protective equipment during  sports activities.  If you have firearms in your house, make sure you follow all gun safety procedures.  Seek help if you have been physically or sexually abused. What's next?  Go to your health care provider once a year for an annual wellness visit.  Ask your health care provider how often you should have your eyes and teeth checked.  Stay up to date on all vaccines. This information is not intended to replace advice given to you by your health care provider. Make sure you discuss any questions you have with your health care provider. Document Revised: 06/21/2020 Document Reviewed: 07/05/2018 Elsevier Patient Education  2021 [place].  Follow-up plan: Return in about  4 weeks (around 04/02/2021) for mood follow up (virtual is fine).  Clearnce Sorrel, DNP, APRN, FNP-BC [facility]

## 2021-03-08 ENCOUNTER — Other Ambulatory Visit: Payer: Self-pay | Admitting: Medical-Surgical

## 2021-03-08 MED ORDER — LAMOTRIGINE 25 MG PO TABS
ORAL_TABLET | ORAL | 0 refills | Status: DC
Start: 2021-03-08 — End: 2021-04-09

## 2021-03-09 LAB — CYTOLOGY - PAP: Diagnosis: NEGATIVE

## 2021-03-15 ENCOUNTER — Other Ambulatory Visit: Payer: Self-pay | Admitting: Medical-Surgical

## 2021-03-16 ENCOUNTER — Encounter: Payer: Self-pay | Admitting: Medical-Surgical

## 2021-04-05 ENCOUNTER — Other Ambulatory Visit: Payer: Self-pay | Admitting: Medical-Surgical

## 2021-04-09 ENCOUNTER — Encounter: Payer: Self-pay | Admitting: Medical-Surgical

## 2021-04-09 ENCOUNTER — Ambulatory Visit: Payer: BC Managed Care – PPO | Admitting: Medical-Surgical

## 2021-04-09 ENCOUNTER — Other Ambulatory Visit: Payer: Self-pay

## 2021-04-09 VITALS — BP 108/70 | HR 107 | Temp 98.0°F | Resp 20 | Ht 65.5 in | Wt 244.0 lb

## 2021-04-09 DIAGNOSIS — F418 Other specified anxiety disorders: Secondary | ICD-10-CM

## 2021-04-09 DIAGNOSIS — G479 Sleep disorder, unspecified: Secondary | ICD-10-CM

## 2021-04-09 DIAGNOSIS — Z23 Encounter for immunization: Secondary | ICD-10-CM | POA: Diagnosis not present

## 2021-04-09 MED ORDER — ESCITALOPRAM OXALATE 10 MG PO TABS: 10.0000 mg | ORAL_TABLET | Freq: Every day | ORAL | 1 refills | Status: DC

## 2021-04-09 MED ORDER — LAMOTRIGINE 25 MG PO TABS: 50.0000 mg | ORAL_TABLET | Freq: Every day | ORAL | 1 refills | Status: DC

## 2021-04-09 NOTE — Progress Notes (Signed)
  Subjective:    CC: Mood follow-up  HPI: Pleasant 25 year old female presenting today for mood follow-up.  She has been taking Lexapro 10 mg daily and Lamictal 50 mg daily.  Tolerating both medications without side effects.  Reports she is doing much better since we switched to Lamictal.  Not having mood swings and feels a lot more stable.  She is also able to sleep well at night.  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  She will be starting that this fall and is very excited.  Denies SI/HI.  I reviewed the past medical history, family history, social history, surgical history, and allergies today and no changes were needed.  Please see the problem list section below in epic for further details.  Past Medical History: No past medical history on file. Past Surgical History: Past Surgical History:  Procedure Laterality Date  . BACK SURGERY     L4-L5 fusion   Social History: Social History   Socioeconomic History  . Marital status: Single    Spouse name: Not on file  . Number of children: Not on file  . Years of education: Not on file  . Highest education level: Not on file  Occupational History  . Not on file  Tobacco Use  . Smoking status: Never Smoker  . Smokeless tobacco: Never Used  Vaping Use  . Vaping Use: Never used  Substance and Sexual Activity  . Alcohol use: Yes    Comment: Occasionally  . Drug use: Never  . Sexual activity: Never  Other Topics Concern  . Not on file  Social History Narrative  . Not on file   Social Determinants of Health   Financial Resource Strain: Not on file  Food Insecurity: Not on file  Transportation Needs: Not on file  Physical Activity: Not on file  Stress: Not on file  Social Connections: Not on file   Family History: Family History  Problem Relation Age of Onset  . Diabetes Mother   . Breast cancer Maternal Grandmother   . Hypertension Maternal Grandfather   . Leukemia Maternal Grandfather    . Breast cancer Paternal Grandmother   . Diabetes Paternal Grandfather    Allergies: Allergies  Allergen Reactions  . Codeine     Childhood reaction   Medications: See med rec.  Review of Systems: See HPI for pertinent positives and negatives.   Objective:    General: Well Developed, well nourished, and in no acute distress.  Neuro: Alert and oriented x3.  HEENT: Normocephalic, atraumatic.  Skin: Warm and dry. Cardiac: Regular rate and rhythm, no murmurs rubs or gallops, no lower extremity edema.  Respiratory: Clear to auscultation bilaterally. Not using accessory muscles, speaking in full sentences.  Impression and Recommendations:    1. Depression with anxiety 2. Trouble in sleeping Seems that we have found a good combination.  Since her symptoms are well controlled and she is doing so much better on this combination, continue Lexapro 10 mg daily and Lamictal 50 mg daily.    3. Need for HPV vaccine HPV #1 vaccination given in office today.  She will be due for her next HPV in 1 to 2 months.  Okay to schedule for nurse visit to get this completed.  Return in about 6 months (around 10/09/2021) for mood follow up. ___________________________________________ Thayer Ohm, DNP, APRN, FNP-BC [facility]

## 2021-05-06 ENCOUNTER — Other Ambulatory Visit: Payer: Self-pay | Admitting: Medical-Surgical

## 2021-05-06 ENCOUNTER — Encounter: Payer: Self-pay | Admitting: Medical-Surgical

## 2021-05-06 DIAGNOSIS — Z111 Encounter for screening for respiratory tuberculosis: Secondary | ICD-10-CM

## 2021-05-07 ENCOUNTER — Ambulatory Visit (INDEPENDENT_AMBULATORY_CARE_PROVIDER_SITE_OTHER): Payer: BC Managed Care – PPO | Admitting: Medical-Surgical

## 2021-05-07 ENCOUNTER — Other Ambulatory Visit: Payer: Self-pay

## 2021-05-07 VITALS — BP 117/62 | HR 74 | Temp 98.1°F | Resp 20 | Ht 65.5 in | Wt 244.0 lb

## 2021-05-07 DIAGNOSIS — Z23 Encounter for immunization: Secondary | ICD-10-CM

## 2021-05-07 DIAGNOSIS — Z111 Encounter for screening for respiratory tuberculosis: Secondary | ICD-10-CM | POA: Diagnosis not present

## 2021-05-07 NOTE — Progress Notes (Signed)
Established Patient Office Visit  Subjective:  Patient ID: Sheryl [name], female    DOB: 21-Sep-1996  Age: 25 y.o. MRN: [id number]  CC:  Chief Complaint  Patient presents with   Immunizations    HPI [name] presents for her second HPV vaccine. Vaccine given in left deltoid, pt tolerated well with no immediate complications.  History reviewed. No pertinent past medical history.  Past Surgical History:  Procedure Laterality Date   BACK SURGERY     L4-L5 fusion    Family History  Problem Relation Age of Onset   Diabetes Mother    Breast cancer Maternal Grandmother    Hypertension Maternal Grandfather    Leukemia Maternal Grandfather    Breast cancer Paternal Grandmother    Diabetes Paternal Grandfather     Social History   Socioeconomic History   Marital status: Single    Spouse name: Not on file   Number of children: Not on file   Years of education: Not on file   Highest education level: Not on file  Occupational History   Not on file  Tobacco Use   Smoking status: Never   Smokeless tobacco: Never  Vaping Use   Vaping Use: Never used  Substance and Sexual Activity   Alcohol use: Yes    Comment: Occasionally   Drug use: Never   Sexual activity: Never  Other Topics Concern   Not on file  Social History Narrative   Not on file   Social Determinants of Health   Financial Resource Strain: Not on file  Food Insecurity: Not on file  Transportation Needs: Not on file  Physical Activity: Not on file  Stress: Not on file  Social Connections: Not on file  Intimate Partner Violence: Not on file    Outpatient Medications Prior to Visit  Medication Sig Dispense Refill   escitalopram (LEXAPRO) 10 MG tablet Take 1 tablet (10 mg total) by mouth daily. 90 tablet 1   lamoTRIgine (LAMICTAL) 25 MG tablet Take 2 tablets (50 mg total) by mouth daily. 180 tablet 1   No facility-administered medications prior to visit.    Allergies  Allergen Reactions   Codeine      Childhood reaction    ROS Review of Systems    Objective:    Physical Exam  BP 117/62 (BP Location: Left Arm, Patient Position: Sitting, Cuff Size: Large)   Pulse 74   Temp 98.1 F (36.7 C) (Oral)   Resp 20   Ht 5' 5.5" (1.664 m)   Wt 244 lb (110.7 kg)   SpO2 99%   BMI 39.99 kg/m  Wt Readings from Last 3 Encounters:  05/07/21 244 lb (110.7 kg)  04/09/21 244 lb (110.7 kg)  03/05/21 238 lb 11.2 oz (108.3 kg)     Health Maintenance Due  Topic Date Due   COVID-19 Vaccine (3 - Booster for Pfizer series) 05/16/2020   HPV VACCINES (3 - 3-dose series) 10/09/2021       Topic Date Due   HPV VACCINES (3 - 3-dose series) 10/09/2021    Lab Results  Component Value Date   TSH 0.75 05/01/2020   Lab Results  Component Value Date   WBC 7.8 05/01/2020   HGB 13.7 05/01/2020   HCT 40.6 05/01/2020   MCV 86.0 05/01/2020   PLT 310 05/01/2020   Lab Results  Component Value Date   NA 139 05/01/2020   K 4.2 05/01/2020   CO2 29 05/01/2020   GLUCOSE 98 05/01/2020  BUN 14 05/01/2020   CREATININE 0.77 05/01/2020   BILITOT 0.4 05/01/2020   AST 14 05/01/2020   ALT 13 05/01/2020   PROT 6.7 05/01/2020   CALCIUM 9.7 05/01/2020   No results found for: CHOL No results found for: HDL No results found for: LDLCALC No results found for: TRIG No results found for: CHOLHDL Lab Results  Component Value Date   HGBA1C 5.6 05/01/2020      Assessment & Plan:  Vaccine given in left deltoid, pt tolerated well with no immediate complications. Problem List Items Addressed This Visit   None Visit Diagnoses     Need for HPV vaccine    -  Primary   Relevant Orders   HPV 9-valent vaccine,Recombinat (Completed)       No orders of the defined types were placed in this encounter.   Follow-up: Return in about 5 months (around 10/11/2021) for 3rd HPV vaccine.    Kathrynn Speed, CMA

## 2021-05-09 LAB — QUANTIFERON-TB GOLD PLUS
Mitogen-NIL: 1.51 IU/mL
NIL: 0.02 IU/mL
QuantiFERON-TB Gold Plus: NEGATIVE
TB1-NIL: 0.01 IU/mL
TB2-NIL: 0.01 IU/mL

## 2021-05-14 ENCOUNTER — Ambulatory Visit: Payer: BC Managed Care – PPO

## 2021-06-28 ENCOUNTER — Encounter: Payer: Self-pay | Admitting: Medical-Surgical

## 2021-07-02 ENCOUNTER — Telehealth (INDEPENDENT_AMBULATORY_CARE_PROVIDER_SITE_OTHER): Payer: BC Managed Care – PPO | Admitting: Medical-Surgical

## 2021-07-02 ENCOUNTER — Encounter: Payer: Self-pay | Admitting: Medical-Surgical

## 2021-07-02 DIAGNOSIS — R41840 Attention and concentration deficit: Secondary | ICD-10-CM

## 2021-07-02 DIAGNOSIS — F418 Other specified anxiety disorders: Secondary | ICD-10-CM

## 2021-07-02 DIAGNOSIS — F819 Developmental disorder of scholastic skills, unspecified: Secondary | ICD-10-CM

## 2021-07-02 MED ORDER — ESCITALOPRAM OXALATE 10 MG PO TABS
10.0000 mg | ORAL_TABLET | Freq: Every day | ORAL | 1 refills | Status: DC
Start: 2021-07-02 — End: 2021-10-22

## 2021-07-02 MED ORDER — LAMOTRIGINE 25 MG PO TABS: 50.0000 mg | ORAL_TABLET | Freq: Every day | ORAL | 1 refills | Status: DC

## 2021-07-02 NOTE — Progress Notes (Signed)
Virtual Visit via Video Note  I connected with Sheryl [name] on 07/02/21 at  9:30 AM EDT by a video enabled telemedicine application and verified that I am speaking with the correct person using two identifiers.   I discussed the limitations of evaluation and management by telemedicine and the availability of in person appointments. The patient expressed understanding and agreed to proceed.  Patient location: home Provider locations: office  Subjective:    CC: Discuss school note request  HPI: Very pleasant 25 year old female presenting today to discuss a note required to accommodate her learning disabilities at school.  She just recently started school for physical therapy and is very excited about this.  She has significant test anxiety and would like a note to accommodate for taking tests in a quiet environment.  She also does better when a test is printed on paper and then later entered into the computer.  She would like to have extended time for testing as well.  Her school is requesting a documentation of her disability and the recommended accommodations.  Doing well on Lexapro 10mg  daily and Lamictal 25mg  twice daily. Mood is stable and the she feels the medications are working. Missed her dose once this week and noted a difference in her symptom control. Denies SI/HI.   Past medical history, Surgical history, Family history not pertinant except as noted below, Social history, Allergies, and medications have been entered into the medical record, reviewed, and corrections made.   Review of Systems: See HPI for pertinent positives and negatives.   Objective:    General: Speaking clearly in complete sentences without any shortness of breath.  Alert and oriented x3.  Normal judgment. No apparent acute distress.  Impression and Recommendations:    1. Learning disabilities 2. 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 Difficulty concentrating Fully discussed recommended accommodations for school.  I feel these  are appropriate to allow her to perform at an optimal level.  Letter written and signed recommending extended testing time, paper test, and testing in a separate room/quiet environment.  Printed and signed copy put at the front desk for patient pickup with digital copy sent via MyChart.  4. Depression with anxiety Symptoms stable on current regimen. Continue Lexapro and Lamictal as prescribed. Advised to change nurse visit appointment in December to an  office visit so that we can follow up on mood and be good for 1 year since she has relocated.   I discussed the assessment and treatment plan with the patient. The patient was provided an opportunity to ask questions and all were answered. The patient agreed with the plan and demonstrated an understanding of the instructions.   The patient was advised to call back or seek an in-person evaluation if the symptoms worsen or if the condition fails to improve as anticipated.  20 minutes of non-face-to-face time was provided during this encounter.  Return if symptoms worsen or fail to improve.  , DNP, APRN, FNP-BC [facility]

## 2021-09-23 ENCOUNTER — Other Ambulatory Visit: Payer: Self-pay

## 2021-09-23 DIAGNOSIS — M25551 Pain in right hip: Secondary | ICD-10-CM

## 2021-09-23 DIAGNOSIS — M791 Myalgia, unspecified site: Secondary | ICD-10-CM

## 2021-09-23 DIAGNOSIS — M545 Low back pain, unspecified: Secondary | ICD-10-CM

## 2021-09-24 NOTE — Addendum Note (Signed)
Addended byChristen Butter on: 09/24/2021 05:18 PM   Modules accepted: Orders

## 2021-09-24 NOTE — Addendum Note (Signed)
Addended byChristen Butter on: 09/24/2021 05:19 PM   Modules accepted: Orders

## 2021-10-22 ENCOUNTER — Other Ambulatory Visit: Payer: Self-pay

## 2021-10-22 ENCOUNTER — Encounter: Payer: Self-pay | Admitting: Medical-Surgical

## 2021-10-22 ENCOUNTER — Ambulatory Visit (INDEPENDENT_AMBULATORY_CARE_PROVIDER_SITE_OTHER): Payer: PPO | Admitting: Medical-Surgical

## 2021-10-22 VITALS — BP 120/75 | HR 66 | Resp 20 | Ht 65.5 in | Wt 259.7 lb

## 2021-10-22 DIAGNOSIS — Z23 Encounter for immunization: Secondary | ICD-10-CM | POA: Diagnosis not present

## 2021-10-22 DIAGNOSIS — F418 Other specified anxiety disorders: Secondary | ICD-10-CM

## 2021-10-22 MED ORDER — SERTRALINE HCL 25 MG PO TABS: ORAL_TABLET | ORAL | 3 refills | Status: DC

## 2021-10-22 NOTE — Progress Notes (Signed)
°  HPI with pertinent ROS:   CC: mood follow up  HPI: Pleasant 25 year old female presenting today for follow-up on mood.  She was previously taking Lexapro 10 mg and Lamictal 25 mg daily as prescribed, tolerating well without side effects but unfortunately she developed some resurgence of her initial brain fog.  She was having difficulty maintaining attention and unable to focus well in school.  Her grades did suffer some because of these issues and she ended up taking herself off both medications.  She does continue to have some issues with depression and anxiety and notes that recent family concerns have worsened this.  She currently has 4 family members that have recently been diagnosed with cancer and her mom also has Alzheimer's.  She notes that her cousin is currently in ICU with COVID on the ventilator.  She is very interested in trying another medication to see if we can find something that will help with her mood without the significant side effects that she was having.  She is currently taking some time off from school due to all the issues going on so that she is able to take care of family issues and return to complete her program.  Denies SI/HI.  I reviewed the past medical history, family history, social history, surgical history, and allergies today and no changes were needed.  Please see the problem list section below in epic for further details.   Physical exam:   General: Well Developed, well nourished, and in no acute distress.  Neuro: Alert and oriented x3.  HEENT: Normocephalic, atraumatic.  Skin: Warm and dry. Cardiac: Regular rate and rhythm, no murmurs rubs or gallops, no lower extremity edema.  Respiratory: Clear to auscultation bilaterally. Not using accessory muscles, speaking in full sentences.  Impression and Recommendations:    1. Depression with anxiety Starting sertraline 12.5 mg daily for 8 days then increase to 25 mg daily.  Discussed effectiveness timeline and  possible side effects that may occur in the first couple of weeks.  2. Need for HPV vaccination HPV #3 given today. - HPV 9-valent vaccine,Recombinat  Return in about 4 weeks (around 11/19/2021) for mood follow up (virtual is fine). ___________________________________________ Thayer Ohm, DNP, APRN, FNP-BC Primary Care and Sports Medicine [facility]

## 2021-11-25 NOTE — Progress Notes (Signed)
°  HPI with pertinent ROS:   CC: mood follow up  HPI: Pleasant 26 year old female presenting today for mood follow up. She has been taking Zoloft 25mg  daily, tolerating well without side effects. Feels the medication is helping quite a bit with her mood and overall motivation. Has started exercising and is working on changing her diet to incorporate more healthy choices. Sleeping well. Wonders if the medication would help a little better if the dose was increased just a little. Denies SI/HI.   I reviewed the past medical history, family history, social history, surgical history, and allergies today and no changes were needed.  Please see the problem list section below in epic for further details.   Physical exam:   Physical Exam Constitutional:      General: She is not in acute distress.    Appearance: Normal appearance. She is obese. She is not ill-appearing, toxic-appearing or diaphoretic.  HENT:     Head: Normocephalic and atraumatic.  Eyes:     General: No scleral icterus.       Right eye: No discharge.        Left eye: No discharge.     Extraocular Movements: Extraocular movements intact.     Conjunctiva/sclera: Conjunctivae normal.     Pupils: Pupils are equal, round, and reactive to light.  Cardiovascular:     Rate and Rhythm: Normal rate and regular rhythm.     Pulses: Normal pulses.     Heart sounds: Normal heart sounds. No murmur heard.   No friction rub. No gallop.  Pulmonary:     Effort: Pulmonary effort is normal. No respiratory distress.     Breath sounds: Normal breath sounds.  Skin:    General: Skin is warm and dry.  Neurological:     Mental Status: She is alert.  Psychiatric:        Mood and Affect: Mood normal.        Behavior: Behavior normal.        Thought Content: Thought content normal.        Judgment: Judgment normal.   Impression and Recommendations:    1. Depression with anxiety Trial increasing dose of Zoloft to 50mg  daily. Advised patient to  monitor for side effects at the higher dose. Continue exercise and healthy lifestyle choices.   Return in about 3 months (around 02/24/2022) for mood follow up. ___________________________________________ , DNP, APRN, FNP-BC Primary Care and Sports Medicine [facility] [place]

## 2021-11-26 ENCOUNTER — Ambulatory Visit (INDEPENDENT_AMBULATORY_CARE_PROVIDER_SITE_OTHER): Payer: PPO | Admitting: Medical-Surgical

## 2021-11-26 ENCOUNTER — Encounter: Payer: Self-pay | Admitting: Medical-Surgical

## 2021-11-26 ENCOUNTER — Other Ambulatory Visit: Payer: Self-pay

## 2021-11-26 VITALS — BP 133/94 | HR 87 | Resp 20 | Ht 65.0 in | Wt 256.0 lb

## 2021-11-26 DIAGNOSIS — F418 Other specified anxiety disorders: Secondary | ICD-10-CM | POA: Diagnosis not present

## 2021-11-26 MED ORDER — SERTRALINE HCL 50 MG PO TABS: 50.0000 mg | ORAL_TABLET | Freq: Every day | ORAL | 1 refills | Status: DC

## 2022-02-23 DIAGNOSIS — F418 Other specified anxiety disorders: Secondary | ICD-10-CM | POA: Insufficient documentation

## 2022-02-23 NOTE — Progress Notes (Signed)
Virtual Visit via Video Note ? ?I connected with Sheryl [name] on 02/24/22 at 10:10 AM EDT by a video enabled telemedicine application and verified that I am speaking with the correct person using two identifiers. ?  ?I discussed the limitations of evaluation and management by telemedicine and the availability of in person appointments. The patient expressed understanding and agreed to proceed. ? ?Patient location: home ?Provider locations: office ? ?Subjective:   ? ?CC: mood follow up ? ?HPI: ?Pleasant 26 year old female presenting via [place] video visit for mood follow-up.  She has been taking sertraline 50 mg daily, tolerating well without side effects.  Feels the medication is working well for her and is doing much better than she was previously.  She is no longer having the fogginess with the fatigue associated with the medication.  She has recently settled some family issues related to sick relatives and feels that she is turning a corner on that stress.  She is interested in possibly increasing the medication a little since she does still have some residual anxiety and patient's symptoms.  Denies SI/HI. ? ?Past medical history, Surgical history, Family history not pertinant except as noted below, Social history, Allergies, and medications have been entered into the medical record, reviewed, and corrections made.  ? ?Review of Systems: See HPI for pertinent positives and negatives.  ? ?Objective:   ? ?General: Speaking clearly in complete sentences without any shortness of breath.  Alert and oriented x3.  Normal judgment. No apparent acute distress. ? ?Impression and Recommendations:   ? ?Depression with anxiety ?Increasing sertraline to 100 mg daily.  Symptoms are currently controlled however she feels the medication could work better.  Advised to try this for a few weeks and I will reach out to her via MyChart for an update on her tolerance and symptom management.  If doing well at that time, we will  follow-up in about 6 months.  Patient verbalized understanding and is agreeable to the plan. ? ? ?I discussed the assessment and treatment plan with the patient. The patient was provided an opportunity to ask questions and all were answered. The patient agreed with the plan and demonstrated an understanding of the instructions. ?  ?The patient was advised to call back or seek an in-person evaluation if the symptoms worsen or if the condition fails to improve as anticipated. ? ?25 minutes of non-face-to-face time was provided during this encounter. ? ?Return in about 6 months (around 08/26/2022) for mood follow up. ? ?Clearnce Sorrel, DNP, APRN, FNP-BC ?[place] ?Primary Care and Sports Medicine ?

## 2022-02-24 ENCOUNTER — Encounter: Payer: Self-pay | Admitting: Medical-Surgical

## 2022-02-24 ENCOUNTER — Telehealth (INDEPENDENT_AMBULATORY_CARE_PROVIDER_SITE_OTHER): Payer: Self-pay | Admitting: Medical-Surgical

## 2022-02-24 VITALS — BP 112/70 | Ht 65.0 in | Wt 247.0 lb

## 2022-02-24 DIAGNOSIS — F418 Other specified anxiety disorders: Secondary | ICD-10-CM

## 2022-02-24 MED ORDER — SERTRALINE HCL 100 MG PO TABS: 100.0000 mg | ORAL_TABLET | Freq: Every day | ORAL | 1 refills | Status: DC

## 2022-02-24 NOTE — Assessment & Plan Note (Signed)
Increasing sertraline to 100 mg daily.  Symptoms are currently controlled however she feels the medication could work better.  Advised to try this for a few weeks and I will reach out to her via MyChart for an update on her tolerance and symptom management.  If doing well at that time, we will follow-up in about 6 months.  Patient verbalized understanding and is agreeable to the plan. ?

## 2022-04-27 DIAGNOSIS — L989 Disorder of the skin and subcutaneous tissue, unspecified: Secondary | ICD-10-CM | POA: Insufficient documentation

## 2022-06-06 NOTE — Progress Notes (Unsigned)
   Complete physical exam  Patient: Sheryl [name]   DOB: May 02, 1996   26 y.o. Female  MRN: [id number]  Subjective:    No chief complaint on file.   Sheryl [name] is a 26 y.o. female who presents today for a complete physical exam. She reports consuming a {diet types:17450} diet. {types:19826} She generally feels {DESC; WELL/FAIRLY WELL/POORLY:18703}. She reports sleeping {DESC; WELL/FAIRLY WELL/POORLY:18703}. She {does/does not:200015} have additional problems to discuss today.    Most recent fall risk assessment:    10/22/2021    2:06 PM  [place] in the past year? 0  Number falls in past yr: 0  Injury with Fall? 0  Risk for fall due to : No Fall Risks  Follow up Falls evaluation completed     Most recent depression screenings:    02/24/2022   10:17 AM 11/26/2021    1:57 PM  PHQ 2/9 Scores  PHQ - 2 Score 1 2  PHQ- 9 Score 5 4    {VISON DENTAL STD PSA (Optional):27386}  {History (Optional):23778}  Patient Care Team: Samuel Bouche, NP as PCP - General (Nurse Practitioner)   Outpatient Medications Prior to Visit  Medication Sig   sertraline (ZOLOFT) 100 MG tablet Take 1 tablet (100 mg total) by mouth daily.   No facility-administered medications prior to visit.    ROS        Objective:     There were no vitals taken for this visit. {Vitals History (Optional):23777}  Physical Exam   No results found for any visits on 06/07/22. {Show previous labs (optional):23779}    Assessment & Plan:    Routine Health Maintenance and Physical Exam  Immunization History  Administered Date(s) Administered   DTaP 10/19/1995, 12/26/1995, 02/19/1996, 03/05/1997, 06/13/2000   HPV 9-valent 04/09/2021, 05/07/2021, 10/22/2021   Hepatitis A, Ped/Adol-2 Dose 02/06/2013   Hepatitis B, ped/adol April 25, 1995, 10/19/1995, 05/21/1996   HiB (PRP-OMP) 10/19/1995, 12/26/1995, 02/19/1996, 03/05/1997   IPV 10/19/1995, 12/26/1995, 02/19/1996, 06/13/2000   Influenza-Unspecified  08/06/2020, 07/08/2021   MMR 12/12/1996, 06/13/2000   Meningococcal B Recombinant 05/22/2015   Meningococcal Conjugate 02/06/2013   PFIZER(Purple Top)SARS-COV-2 Vaccination 11/20/2019, 12/18/2019   Pneumococcal-Unspecified 09/06/1999   Tdap 08/20/2009, 06/21/2016   Varicella 08/27/1996    Health Maintenance  Topic Date Due   Hepatitis C Screening  Never done   INFLUENZA VACCINE  06/07/2022   PAP-Cervical Cytology Screening  03/05/2024   PAP SMEAR-Modifier  03/05/2024   TETANUS/TDAP  06/21/2026   HPV VACCINES  Completed   HIV Screening  Completed   COVID-19 Vaccine  Discontinued    Discussed health benefits of physical activity, and encouraged her to engage in regular exercise appropriate for her age and condition.  Problem List Items Addressed This Visit   None Visit Diagnoses     Annual physical exam    -  Primary   Screening-pulmonary TB          No follow-ups on file.     Samuel Bouche, NP

## 2022-06-07 ENCOUNTER — Encounter: Payer: Self-pay | Admitting: Medical-Surgical

## 2022-06-07 ENCOUNTER — Ambulatory Visit (INDEPENDENT_AMBULATORY_CARE_PROVIDER_SITE_OTHER): Payer: Self-pay | Admitting: Medical-Surgical

## 2022-06-07 VITALS — BP 97/64 | HR 68 | Resp 20 | Ht 65.0 in | Wt 259.1 lb

## 2022-06-07 DIAGNOSIS — Z111 Encounter for screening for respiratory tuberculosis: Secondary | ICD-10-CM

## 2022-06-07 DIAGNOSIS — Z0000 Encounter for general adult medical examination without abnormal findings: Secondary | ICD-10-CM

## 2022-06-07 LAB — TIQ- MISLABELED
DATE RECEIVED:: 8012023
Test Ordered On Req: 3963

## 2022-06-10 ENCOUNTER — Encounter: Payer: Self-pay | Admitting: Medical-Surgical

## 2022-06-10 LAB — LIPID PANEL
Cholesterol: 218 mg/dL — ABNORMAL HIGH (ref <200)
HDL: 42 mg/dL — ABNORMAL LOW (ref >=50)
LDL Cholesterol (Calc): 144 mg/dL (calc) — ABNORMAL HIGH
Non-HDL Cholesterol (Calc): 176 mg/dL (calc) — ABNORMAL HIGH (ref <130)
Total CHOL/HDL Ratio: 5.2 (calc) — ABNORMAL HIGH (ref <5.0)
Triglycerides: 180 mg/dL — ABNORMAL HIGH (ref <150)

## 2022-06-10 LAB — QUANTIFERON-TB GOLD PLUS
Mitogen-NIL: 8.09 IU/mL
NIL: 0.03 IU/mL
QuantiFERON-TB Gold Plus: NEGATIVE
TB1-NIL: 0 IU/mL
TB2-NIL: 0 IU/mL

## 2022-06-10 LAB — CBC WITH DIFFERENTIAL/PLATELET
Absolute Monocytes: 547 cells/uL (ref 200–950)
Basophils Absolute: 50 cells/uL (ref 0–200)
Basophils Relative: 0.7 %
Eosinophils Absolute: 121 cells/uL (ref 15–500)
Eosinophils Relative: 1.7 %
HCT: 41.3 % (ref 35.0–45.0)
Hemoglobin: 13.8 g/dL (ref 11.7–15.5)
Lymphs Abs: 2237 cells/uL (ref 850–3900)
MCH: 28.6 pg (ref 27.0–33.0)
MCHC: 33.4 g/dL (ref 32.0–36.0)
MCV: 85.7 fL (ref 80.0–100.0)
MPV: 11 fL (ref 7.5–12.5)
Monocytes Relative: 7.7 %
Neutro Abs: 4146 cells/uL (ref 1500–7800)
Neutrophils Relative %: 58.4 %
Platelets: 315 10*3/uL (ref 140–400)
RBC: 4.82 10*6/uL (ref 3.80–5.10)
RDW: 13.4 % (ref 11.0–15.0)
Total Lymphocyte: 31.5 %
WBC: 7.1 10*3/uL (ref 3.8–10.8)

## 2022-06-10 LAB — COMPLETE METABOLIC PANEL WITH GFR
AG Ratio: 1.5 (calc) (ref 1.0–2.5)
ALT: 25 U/L (ref 6–29)
AST: 19 U/L (ref 10–30)
Albumin: 4.2 g/dL (ref 3.6–5.1)
Alkaline phosphatase (APISO): 64 U/L (ref 31–125)
BUN: 11 mg/dL (ref 7–25)
CO2: 26 mmol/L (ref 20–32)
Calcium: 9.6 mg/dL (ref 8.6–10.2)
Chloride: 105 mmol/L (ref 98–110)
Creat: 0.77 mg/dL (ref 0.50–0.96)
Globulin: 2.8 g/dL (calc) (ref 1.9–3.7)
Glucose, Bld: 93 mg/dL (ref 65–99)
Potassium: 4.5 mmol/L (ref 3.5–5.3)
Sodium: 140 mmol/L (ref 135–146)
Total Bilirubin: 0.3 mg/dL (ref 0.2–1.2)
Total Protein: 7 g/dL (ref 6.1–8.1)
eGFR: 109 mL/min/{1.73_m2} (ref >=60)

## 2022-08-05 IMAGING — DX DG HAND COMPLETE 3+V*L*
3 series · 3 of 3 positions shown · non-contrast
Comparison: None.

CLINICAL DATA: Left thumb pain for several months with no known
injury

EXAM:
LEFT HAND - COMPLETE 3+ VIEW

[hand pa]
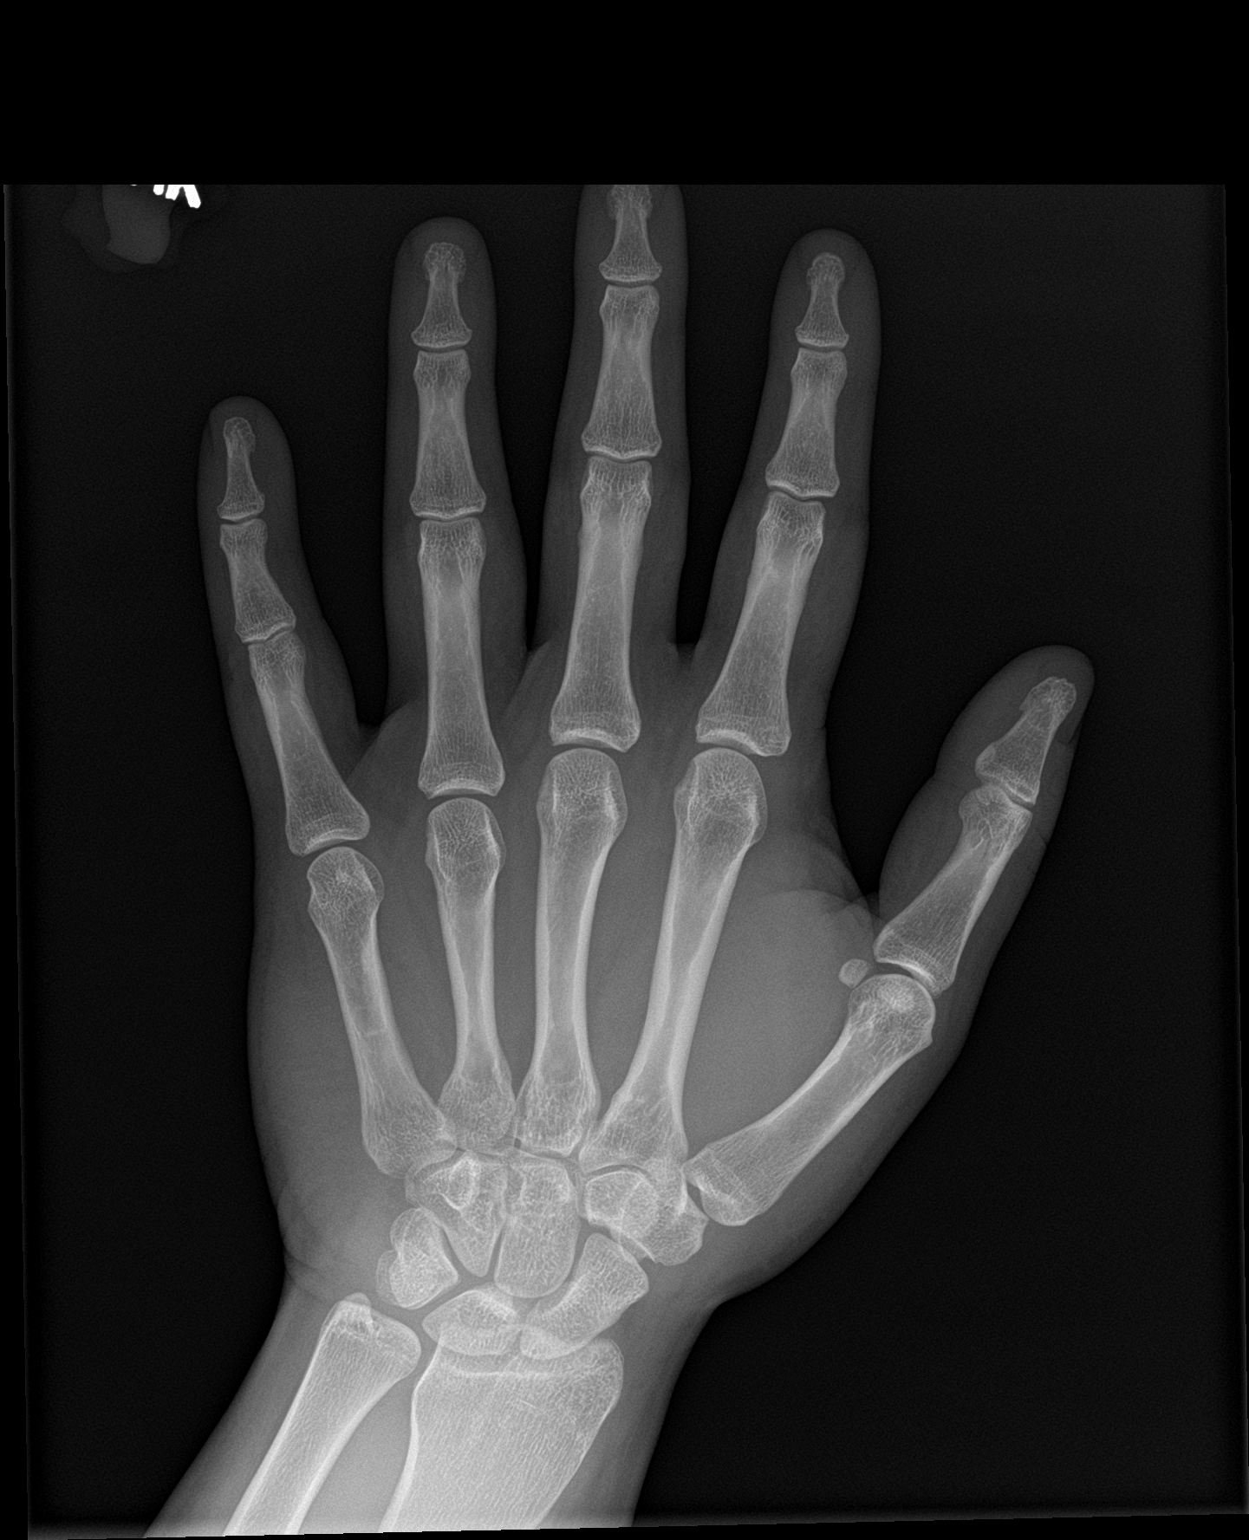

[hand obl]
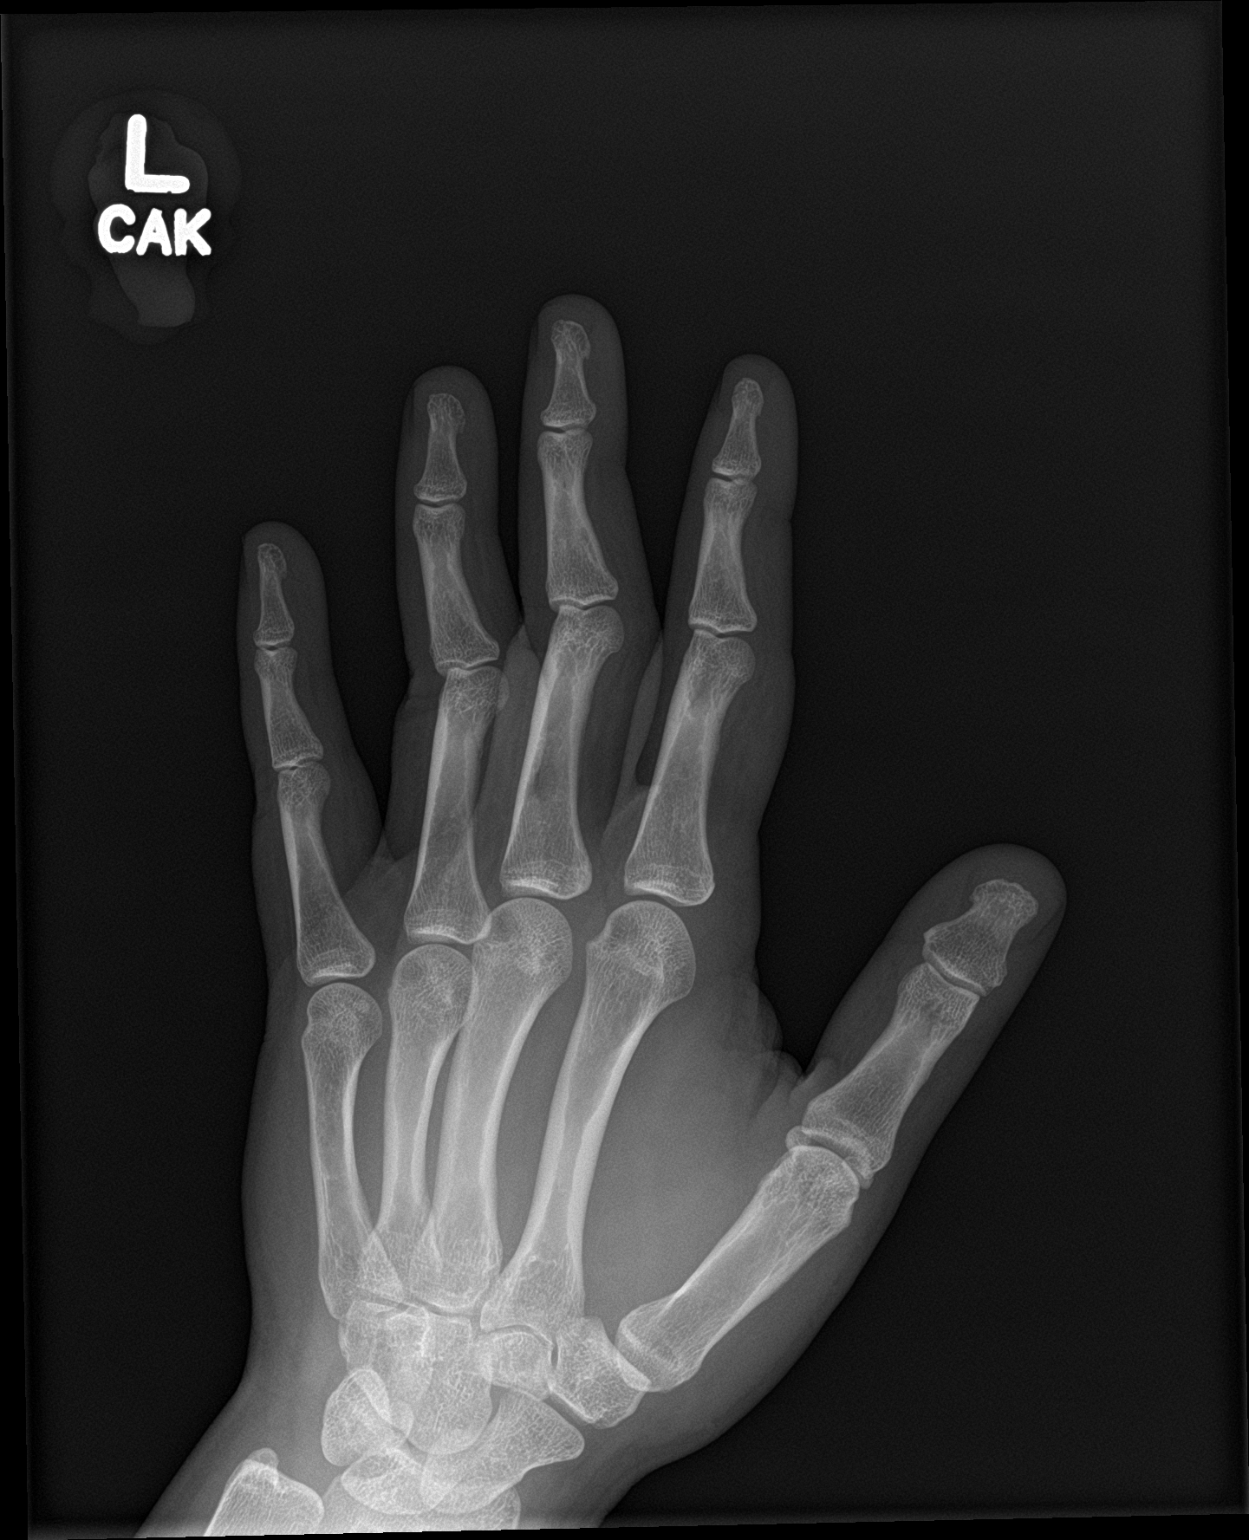

[hand lat]
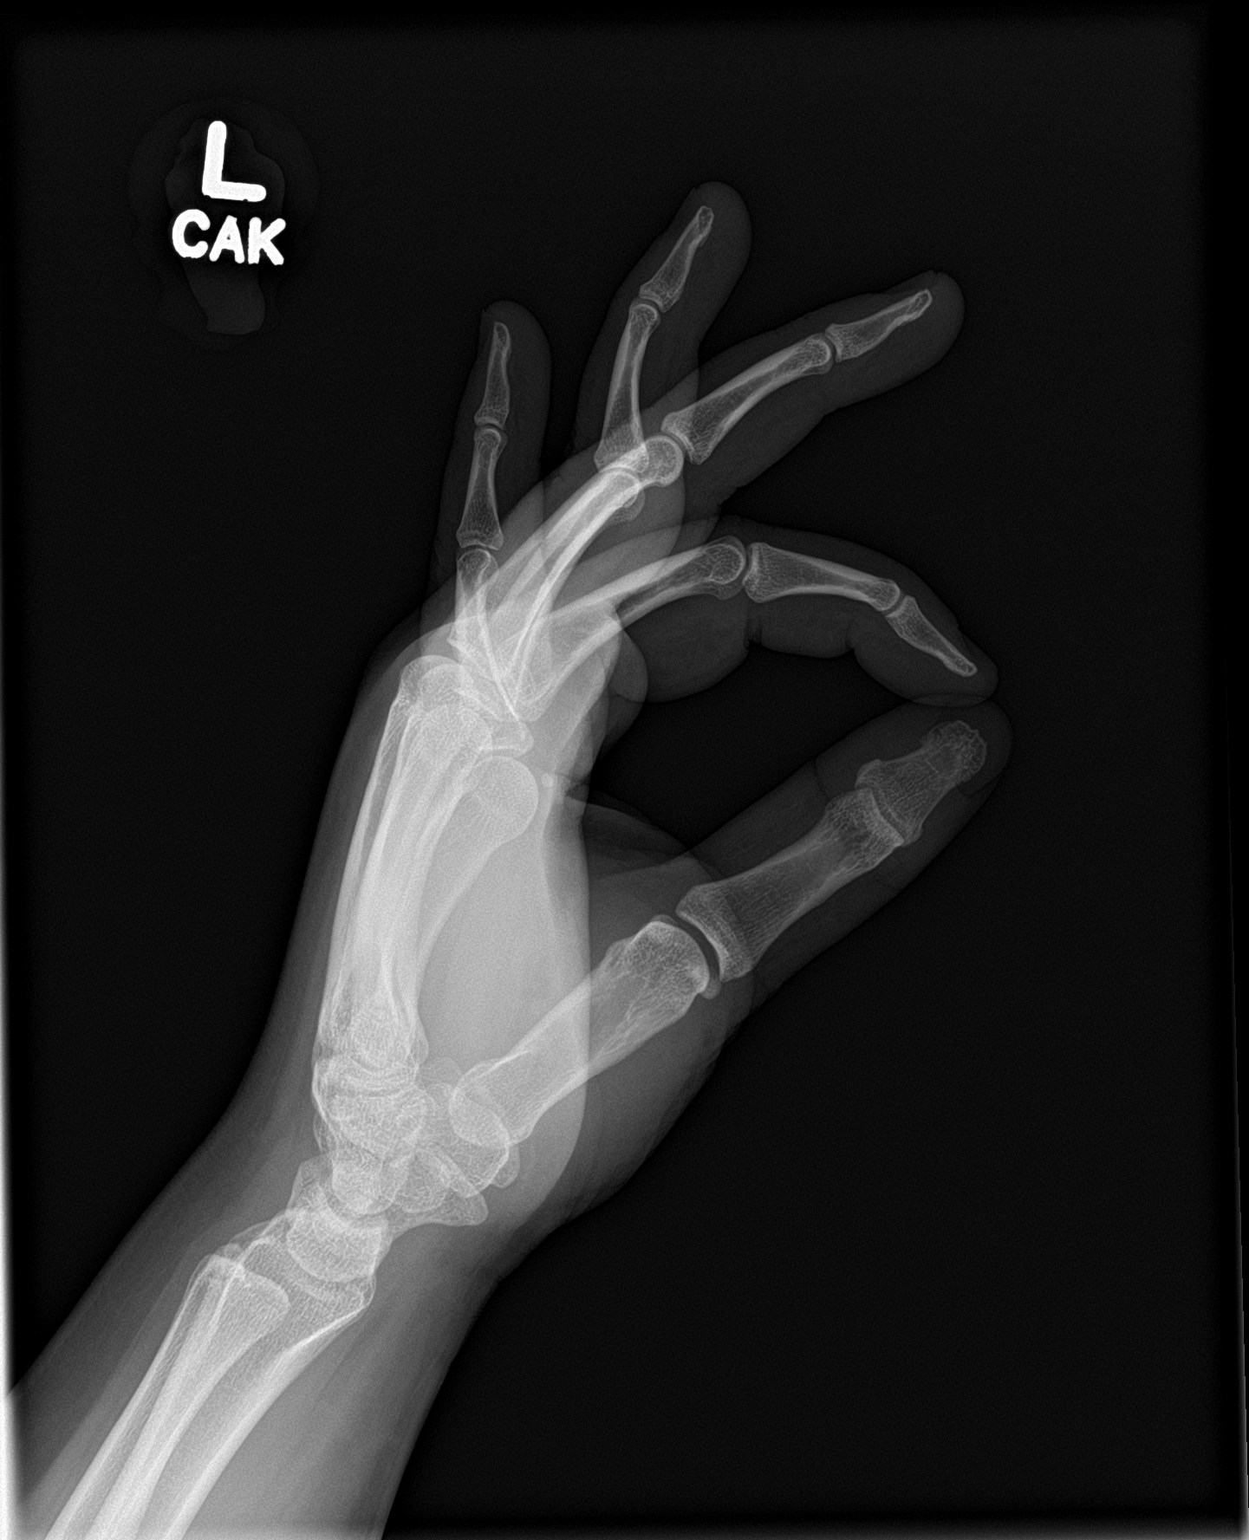

[3 of 3 positions shown; findings below may reference images not displayed]

FINDINGS: There is no evidence of acute or healing fracture or dislocation.
There is no evidence of arthropathy or other focal bone abnormality.
Soft tissues are unremarkable.
IMPRESSION: Negative.

## 2022-11-02 ENCOUNTER — Encounter: Payer: Self-pay | Admitting: Medical-Surgical

## 2022-11-10 ENCOUNTER — Ambulatory Visit: Payer: Self-pay | Admitting: Medical-Surgical

## 2022-11-29 ENCOUNTER — Encounter: Payer: Self-pay | Admitting: Medical-Surgical

## 2022-11-29 ENCOUNTER — Ambulatory Visit: Payer: Managed Care, Other (non HMO) | Admitting: Medical-Surgical

## 2022-11-29 VITALS — BP 115/66 | HR 96 | Resp 20 | Ht 65.0 in | Wt 274.8 lb

## 2022-11-29 DIAGNOSIS — L989 Disorder of the skin and subcutaneous tissue, unspecified: Secondary | ICD-10-CM

## 2022-11-29 DIAGNOSIS — F418 Other specified anxiety disorders: Secondary | ICD-10-CM | POA: Diagnosis not present

## 2022-11-29 MED ORDER — SERTRALINE HCL 100 MG PO TABS: 150.0000 mg | ORAL_TABLET | Freq: Every day | ORAL | 1 refills | Status: DC

## 2022-11-29 NOTE — Progress Notes (Signed)
Established Patient Office Visit  Subjective   Patient ID: Sheryl Sheryl [name], Sheryl [name]   DOB: [birth year] Age: 27 y.o. MRN: [id number]   Chief Complaint  Patient presents with   Follow-up   MOOD   HPI Sheryl 27 year old Sheryl [name] presenting today for the following:  Mood: Taking sertraline 100 mg daily.  Had tried to take 50 mg 3 times daily but admits that she never seem to remember all the doses in 1 day.  Feels that the medication is helping some but wonders if it could work a little better at a higher dose.  She does have significantly less anxiety than she did before but reports that this is partially related to deciding not to go back to school.  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  Denies SI/HI.  Skin concerns: Since she is working for a dermatologist and knows the importance of yearly skin checks, she is requesting to have a referral placed today.  She has a couple of areas that she is a bit concerned about and would like to have checked out.      11/29/2022    9:23 AM 02/24/2022   10:17 AM 11/26/2021    1:57 PM 10/22/2021    2:06 PM 04/09/2021    2:53 PM  Depression screen PHQ 2/9  Decreased Interest 0 0 1 2 0  Down, Depressed, Hopeless 1 1 1 2  0  PHQ - 2 Score 1 1 2 4  0  Altered sleeping  3 1 2    Tired, decreased energy  0 0 2   Change in appetite  0 0 2   Feeling bad or failure about yourself   0 0 1   Trouble concentrating  0 1 2   Moving slowly or fidgety/restless  1 0 3   Suicidal thoughts  0 0 0   PHQ-9 Score  5 4 16    Difficult doing work/chores  Somewhat difficult Somewhat difficult Somewhat difficult       11/26/2021    1:57 PM 10/22/2021    2:06 PM 02/05/2021    1:53 PM 01/08/2021   11:34 AM  GAD 7 : Generalized Anxiety Score  Nervous, Anxious, on Edge 1 2 2 2   Control/stop worrying 1 2 1 2   Worry too much - different things 1 2 2 2   Trouble relaxing 0 2 2 2   Restless 1 2 3 2   Easily annoyed or irritable 1 2 1 2    Afraid - awful might happen 0 2 1 2   Total GAD 7 Score 5 14 12 14   Anxiety Difficulty Somewhat difficult Somewhat difficult Somewhat difficult Somewhat difficult   Objective:    Vitals:   11/29/22 0921  BP: 115/66  Pulse: 96  Resp: 20  Height: 5\' 5"  (1.651 m)  Weight: 274 lb 12.8 oz (124.6 kg)  SpO2: 98%  BMI (Calculated): 45.73   Physical Exam Vitals reviewed.  Constitutional:      General: She is not in acute distress.    Appearance: Normal appearance. She is not ill-appearing.  HENT:     Head: Normocephalic and atraumatic.  Cardiovascular:     Rate and Rhythm: Normal rate and regular rhythm.     Pulses: Normal pulses.     Heart sounds: Normal heart sounds.  Pulmonary:     Effort: Pulmonary effort is normal. No respiratory distress.     Breath sounds: Normal breath sounds. No wheezing, rhonchi or rales.  Skin:  General: Skin is warm and dry.  Neurological:     Mental Status: She is alert and oriented to person, place, and time.  Psychiatric:        Mood and Affect: Mood normal.        Behavior: Behavior normal.        Thought Content: Thought content normal.        Judgment: Judgment normal.   No results found for this or any previous visit (from the past 24 hour(s)).     The ASCVD Risk score (Arnett DK, et al., 2019) failed to calculate for the following reasons:   The 2019 ASCVD risk score is only valid for ages 54 to 10   Assessment & Plan:   1. Depression with anxiety She is doing fairly well but feels the medication could work just a little better.  She would like to try increasing to sertraline 150 mg daily to see if this manages her symptoms a little better.  2. Skin lesion Refer to dermatology patient request. - Ambulatory referral to Dermatology   Return in about 6 weeks (around 01/10/2023) for mood follow up.  ___________________________________________ Clearnce Sorrel, DNP, APRN, FNP-BC Primary Care and [place]

## 2023-01-02 ENCOUNTER — Encounter: Payer: Self-pay | Admitting: Medical-Surgical

## 2023-01-13 ENCOUNTER — Telehealth (INDEPENDENT_AMBULATORY_CARE_PROVIDER_SITE_OTHER): Payer: Managed Care, Other (non HMO) | Admitting: Medical-Surgical

## 2023-01-13 ENCOUNTER — Encounter: Payer: Self-pay | Admitting: Medical-Surgical

## 2023-01-13 DIAGNOSIS — F418 Other specified anxiety disorders: Secondary | ICD-10-CM

## 2023-01-13 MED ORDER — SERTRALINE HCL 100 MG PO TABS
150.0000 mg | ORAL_TABLET | Freq: Every day | ORAL | 1 refills | Status: DC
Start: 2023-01-13 — End: 2023-11-23

## 2023-01-13 NOTE — Progress Notes (Signed)
Virtual Visit via Video Note  I connected with Sheryl [name] on 01/13/23 at  8:10 AM EST by a video enabled telemedicine application and verified that I am speaking with the correct person using two identifiers.   I discussed the limitations of evaluation and management by telemedicine and the availability of in person appointments. The patient expressed understanding and agreed to proceed.  Patient location: home Provider locations: office  Subjective:    CC: Mood follow-up  HPI: Pleasant 27 year old female presenting today via [place] video visit to follow-up on depression and anxiety.  Approximately 6 weeks ago, we increased her sertraline to 150 mg daily in an effort to control her symptoms better.  Today she reports that she is doing very well on the increased dose of Zoloft.  Feels that the medication effects last longer and no longer has the afternoon/evening slump that she was experiencing.  She has had no excessive grogginess or concerning side effects with the increase in dose.  Continues to do counseling every other week and feels like she is at a good place.  Denies SI/HI.  Past medical history, Surgical history, Family history not pertinant except as noted below, Social history, Allergies, and medications have been entered into the medical record, reviewed, and corrections made.   Review of Systems: See HPI for pertinent positives and negatives.   Objective:    General: Speaking clearly in complete sentences without any shortness of breath.  Alert and oriented x3.  Normal judgment. No apparent acute distress.  Impression and Recommendations:    1. Depression with anxiety Stable.  Continue Zoloft 150 mg daily.  Continue counseling as instructed.   I discussed the assessment and treatment plan with the patient. The patient was provided an opportunity to ask questions and all were answered. The patient agreed with the plan and demonstrated an understanding of the instructions.    The patient was advised to call back or seek an in-person evaluation if the symptoms worsen or if the condition fails to improve as anticipated.  25 minutes of non-face-to-face time was provided during this encounter.  Return in about 6 months (around 07/16/2023) for mood follow up.  Clearnce Sorrel, DNP, APRN, FNP-BC [facility] and [facility] Medicine

## 2023-03-02 ENCOUNTER — Encounter: Payer: Self-pay | Admitting: Medical-Surgical

## 2023-03-30 DIAGNOSIS — L309 Dermatitis, unspecified: Secondary | ICD-10-CM | POA: Insufficient documentation

## 2023-03-30 DIAGNOSIS — L709 Acne, unspecified: Secondary | ICD-10-CM | POA: Insufficient documentation

## 2023-06-09 ENCOUNTER — Encounter: Payer: Self-pay | Admitting: Medical-Surgical

## 2023-06-09 ENCOUNTER — Ambulatory Visit (INDEPENDENT_AMBULATORY_CARE_PROVIDER_SITE_OTHER): Payer: Managed Care, Other (non HMO) | Admitting: Medical-Surgical

## 2023-06-09 VITALS — BP 115/77 | HR 101 | Ht 65.0 in | Wt 296.1 lb

## 2023-06-09 DIAGNOSIS — F418 Other specified anxiety disorders: Secondary | ICD-10-CM | POA: Diagnosis not present

## 2023-06-09 DIAGNOSIS — Z0000 Encounter for general adult medical examination without abnormal findings: Secondary | ICD-10-CM | POA: Diagnosis not present

## 2023-06-09 DIAGNOSIS — Z13220 Encounter for screening for lipoid disorders: Secondary | ICD-10-CM | POA: Diagnosis not present

## 2023-06-09 NOTE — Progress Notes (Signed)
Complete physical exam  Patient: Sheryl [name]   DOB: Oct 18, 1996   27 y.o. Female  MRN: [id number]  Subjective:    Chief Complaint  Patient presents with   Annual Exam   Sheryl [name] is a 27 y.o. female who presents today for a complete physical exam. She reports consuming a  gluten and dairy free  diet.  Doing [employer] 4-5 weekly.   She generally feels well. She reports sleeping well. She does not have additional problems to discuss today.   Most recent fall risk assessment:    06/09/2023    8:45 AM  Fall Risk   Falls in the past year? 0  Number falls in past yr: 0  Injury with Fall? 0  Risk for fall due to : No Fall Risks  Follow up Falls evaluation completed     Most recent depression screenings:    06/09/2023    9:18 AM 06/09/2023    8:45 AM  PHQ 2/9 Scores  PHQ - 2 Score 1 1  PHQ- 9 Score 5     Vision:Within last year, Dental: No current dental problems and Receives regular dental care, and STD: The patient denies history of sexually transmitted disease.    Patient Care Team: Christen Butter, NP as PCP - General (Nurse Practitioner)   Outpatient Medications Prior to Visit  Medication Sig   doxycycline (VIBRA-TABS) 100 MG tablet Take by mouth.   Doxycycline Hyclate 50 MG TABS    sertraline (ZOLOFT) 100 MG tablet Take 1.5 tablets (150 mg total) by mouth daily.   spironolactone (ALDACTONE) 50 MG tablet Take by mouth.   tretinoin (RETIN-A) 0.025 % cream Apply pea-sized amount to face nightly as tolerated. If irritating, decrease frequency of use.   No facility-administered medications prior to visit.    Review of Systems  Constitutional:  Negative for chills, fever, malaise/fatigue and weight loss.  HENT:  Negative for congestion, ear pain, hearing loss, sinus pain and sore throat.   Eyes:  Negative for blurred vision, photophobia and pain.  Respiratory:  Negative for cough, shortness of breath and wheezing.   Cardiovascular:  Negative for chest pain, palpitations  and leg swelling.  Gastrointestinal:  Negative for abdominal pain, constipation, diarrhea, heartburn, nausea and vomiting.  Genitourinary:  Negative for dysuria, frequency and urgency.  Musculoskeletal:  Negative for falls and neck pain.  Skin:  Negative for itching and rash.  Neurological:  Negative for dizziness, weakness and headaches.  Endo/Heme/Allergies:  Negative for polydipsia. Does not bruise/bleed easily.  Psychiatric/Behavioral:  Negative for depression, substance abuse and suicidal ideas. The patient is not nervous/anxious.      Objective:     BP 115/77   Pulse (!) 101   Ht 5\' 5"  (1.651 m)   Wt 296 lb 1.9 oz (134.3 kg)   SpO2 99%   BMI 49.28 kg/m    Physical Exam Constitutional:      General: She is not in acute distress.    Appearance: Normal appearance. She is obese. She is not ill-appearing.  HENT:     Head: Normocephalic and atraumatic.     Right Ear: Tympanic membrane, ear canal and external ear normal. There is no impacted cerumen.     Left Ear: Tympanic membrane, ear canal and external ear normal. There is no impacted cerumen.     Nose: Nose normal. No congestion or rhinorrhea.     Mouth/Throat:     Mouth: Mucous membranes are moist.     Pharynx: No  oropharyngeal exudate or posterior oropharyngeal erythema.  Eyes:     General: No scleral icterus.       Right eye: No discharge.        Left eye: No discharge.     Extraocular Movements: Extraocular movements intact.     Conjunctiva/sclera: Conjunctivae normal.     Pupils: Pupils are equal, round, and reactive to light.  Neck:     Thyroid: No thyromegaly.     Vascular: No carotid bruit or JVD.     Trachea: Trachea normal.  Cardiovascular:     Rate and Rhythm: Normal rate and regular rhythm.     Pulses: Normal pulses.     Heart sounds: Normal heart sounds. No murmur heard.    No friction rub. No gallop.  Pulmonary:     Effort: Pulmonary effort is normal. No respiratory distress.     Breath sounds:  Normal breath sounds. No wheezing.  Abdominal:     General: Bowel sounds are normal. There is no distension.     Palpations: Abdomen is soft.     Tenderness: There is no abdominal tenderness. There is no guarding.  Musculoskeletal:        General: Normal range of motion.     Cervical back: Normal range of motion and neck supple.  Lymphadenopathy:     Cervical: No cervical adenopathy.  Skin:    General: Skin is warm and dry.  Neurological:     Mental Status: She is alert and oriented to person, place, and time.     Cranial Nerves: No cranial nerve deficit.  Psychiatric:        Mood and Affect: Mood normal.        Behavior: Behavior normal.        Thought Content: Thought content normal.        Judgment: Judgment normal.      No results found for any visits on 06/09/23.     Assessment & Plan:    Routine Health Maintenance and Physical Exam  Immunization History  Administered Date(s) Administered   DTaP 10/19/1995, 12/26/1995, 02/19/1996, 03/05/1997, 06/13/2000   HIB (PRP-OMP) 10/19/1995, 12/26/1995, 02/19/1996, 03/05/1997   HPV 9-valent 04/09/2021, 05/07/2021, 10/22/2021   Hepatitis A, Ped/Adol-2 Dose 02/06/2013   Hepatitis B, PED/ADOLESCENT 01-18-1995, 10/19/1995, 05/21/1996   IPV 10/19/1995, 12/26/1995, 02/19/1996, 06/13/2000   Influenza-Unspecified 08/06/2020, 07/08/2021, 08/15/2022   MMR 12/12/1996, 06/13/2000   Meningococcal B Recombinant 05/22/2015   Meningococcal Conjugate 02/06/2013   PFIZER(Purple Top)SARS-COV-2 Vaccination 11/20/2019, 12/18/2019   Pneumococcal-Unspecified 09/06/1999   Tdap 08/20/2009, 06/21/2016   Varicella 08/27/1996    Health Maintenance  Topic Date Due   INFLUENZA VACCINE  06/08/2023   HIV Screening  06/08/2024 (Originally 08/21/2010)   PAP-Cervical Cytology Screening  03/05/2024   PAP SMEAR-Modifier  03/05/2024   DTaP/Tdap/Td (8 - Td or Tdap) 06/21/2026   HPV VACCINES  Completed   COVID-19 Vaccine  Discontinued   Hepatitis C  Screening  Discontinued    Discussed health benefits of physical activity, and encouraged her to engage in regular exercise appropriate for her age and condition.  1. Annual physical exam Checking labs as below.  Up-to-date on preventative care.  Wellness information provided with AVS. - CBC with Differential/Platelet - CMP14+EGFR  2. Depression with anxiety Stable.  Continue sertraline 150 mg daily.  3. Morbid obesity (HCC) Continues to have difficulty with weight loss despite regular intentional exercise and dietary modifications.  Checking labs as below. - Hemoglobin A1c - TSH  4. Lipid screening  Checking lipid panel today. - Lipid panel  Return in about 1 year (around 06/08/2024) for annual physical exam.   Christen Butter, NP

## 2023-06-10 LAB — CMP14+EGFR
ALT: 28 IU/L (ref 0–32)
AST: 21 IU/L (ref 0–40)
Albumin: 4.2 g/dL (ref 4.0–5.0)
Alkaline Phosphatase: 85 IU/L (ref 44–121)
BUN/Creatinine Ratio: 13 (ref 9–23)
BUN: 12 mg/dL (ref 6–20)
Bilirubin Total: <0.2 mg/dL (ref 0.0–1.2)
CO2: 23 mmol/L (ref 20–29)
Calcium: 9.5 mg/dL (ref 8.7–10.2)
Chloride: 102 mmol/L (ref 96–106)
Creatinine, Ser: 0.96 mg/dL (ref 0.57–1.00)
Globulin, Total: 2.4 g/dL (ref 1.5–4.5)
Glucose: 104 mg/dL — ABNORMAL HIGH (ref 70–99)
Potassium: 4.6 mmol/L (ref 3.5–5.2)
Sodium: 142 mmol/L (ref 134–144)
Total Protein: 6.6 g/dL (ref 6.0–8.5)
eGFR: 83 mL/min/1.73

## 2023-07-10 ENCOUNTER — Telehealth: Payer: Self-pay | Admitting: Physician Assistant

## 2023-07-10 DIAGNOSIS — J019 Acute sinusitis, unspecified: Secondary | ICD-10-CM

## 2023-07-10 DIAGNOSIS — B9689 Other specified bacterial agents as the cause of diseases classified elsewhere: Secondary | ICD-10-CM

## 2023-07-11 MED ORDER — AMOXICILLIN-POT CLAVULANATE 875-125 MG PO TABS: 1.0000 | ORAL_TABLET | Freq: Two times a day (BID) | ORAL | 0 refills | Status: DC

## 2023-07-11 NOTE — Progress Notes (Signed)

## 2023-07-11 NOTE — Progress Notes (Signed)
I have spent 5 minutes in review of e-visit questionnaire, review and updating patient chart, medical decision making and response to patient.   William Cody Martin, PA-C    

## 2023-07-31 ENCOUNTER — Telehealth: Payer: Self-pay | Admitting: Medical-Surgical

## 2023-08-08 ENCOUNTER — Ambulatory Visit (INDEPENDENT_AMBULATORY_CARE_PROVIDER_SITE_OTHER): Payer: Self-pay | Admitting: Medical-Surgical

## 2023-08-08 ENCOUNTER — Ambulatory Visit: Payer: Managed Care, Other (non HMO)

## 2023-08-08 VITALS — BP 109/77 | HR 87 | Resp 20 | Ht 65.0 in | Wt 297.0 lb

## 2023-08-08 DIAGNOSIS — B9689 Other specified bacterial agents as the cause of diseases classified elsewhere: Secondary | ICD-10-CM | POA: Diagnosis not present

## 2023-08-08 DIAGNOSIS — R202 Paresthesia of skin: Secondary | ICD-10-CM | POA: Diagnosis not present

## 2023-08-08 DIAGNOSIS — J019 Acute sinusitis, unspecified: Secondary | ICD-10-CM | POA: Diagnosis not present

## 2023-08-08 MED ORDER — AZITHROMYCIN 250 MG PO TABS: ORAL_TABLET | ORAL | 0 refills | Status: AC

## 2023-08-08 MED ORDER — METHYLPREDNISOLONE 4 MG PO TBPK
ORAL_TABLET | ORAL | 0 refills | Status: DC
Start: 2023-08-08 — End: 2024-04-16

## 2023-08-08 MED ORDER — WEGOVY 0.25 MG/0.5ML ~~LOC~~ SOAJ
0.2500 mg | SUBCUTANEOUS | 0 refills | Status: DC
Start: 2023-08-08 — End: 2024-06-21

## 2023-08-08 NOTE — Progress Notes (Signed)
        Established patient visit  History, exam, impression, and plan:  1. Paresthesia of both hands Very pleasant 27 year old female presenting today with reports of intermittent paresthesias affecting bilateral thumbs and fifth fingers.  This has been on and off for the last year but has gotten more frequent.  Sometimes has difficulties with being able to pick up items due to numbness/tingling.  Feels that a lot of her symptoms do come from her neck but has not had any dedicated cervical spine imaging.  Open to further investigation today.  Plan for cervical spine x-rays.  If negative, may benefit from nerve conduction study.  No symptoms during appointment for evaluation and bilateral upper extremities neurovascularly intact. - DG Cervical Spine Complete; Future  2. Morbid obesity (HCC) Was able to get in touch with her insurance and reports that [facility] is a covered medication.  She would like a referral to [facility] with [facility] as an adjunct to using medication for weight loss.  Reports that if we send Wegovy in, this will need to go to an employee specialty pharmacy in [place] due to issues with back orders in the nearby locations.  Current BMI of 49.42 and would greatly benefit from weight loss.  Starting Wegovy 0.25 mg weekly x 4 weeks with upward titration every 4 weeks to max dose of 2.4 mg.  Referral placed for [facility] as requested. - WEGOVY 0.25 MG/0.5ML SOAJ; Inject 0.25 mg into the skin once a week. Use this dose for 1 month (4 shots) and then increase to next higher dose.  Dispense: 2 mL; Refill: 0  3. Acute bacterial sinusitis Was seen on 07/10/2023 with an e-visit and diagnosed with acute bacterial sinusitis.  Was treated with Augmentin which did help her symptoms however she is left with lingering symptoms of postnasal drip, productive cough of yellow thick mucus, occasional headache, and maxillary sinus tenderness/pressure.  Has tried over-the-counter agents but nothing  seems to be helping.  Exam consistent with lingering sinusitis.  Adding azithromycin and Medrol Dosepak today.  Okay to continue over-the-counter medications for symptom management.   Procedures performed this visit: None.  Return if symptoms worsen or fail to improve.  __________________________________ Thayer Ohm, DNP, APRN, FNP-BC Primary Care and Sports Medicine [facility] [place]

## 2023-09-08 ENCOUNTER — Ambulatory Visit: Payer: Self-pay | Admitting: Medical-Surgical

## 2023-09-18 ENCOUNTER — Encounter: Payer: Self-pay | Admitting: Medical-Surgical

## 2023-11-23 ENCOUNTER — Other Ambulatory Visit: Payer: Self-pay

## 2023-11-23 MED ORDER — SERTRALINE HCL 100 MG PO TABS: 150.0000 mg | ORAL_TABLET | Freq: Every day | ORAL | 1 refills | Status: DC

## 2024-04-16 ENCOUNTER — Ambulatory Visit: Admitting: Medical-Surgical

## 2024-04-16 ENCOUNTER — Ambulatory Visit: Attending: Medical-Surgical

## 2024-04-16 ENCOUNTER — Encounter: Payer: Self-pay | Admitting: Medical-Surgical

## 2024-04-16 VITALS — BP 118/77 | HR 81 | Resp 20 | Ht 65.0 in | Wt 305.1 lb

## 2024-04-16 DIAGNOSIS — R079 Chest pain, unspecified: Secondary | ICD-10-CM

## 2024-04-16 NOTE — Progress Notes (Unsigned)
        Established patient visit  History, exam, impression, and plan:  1. Chest pain, unspecified type (Primary) Pleasant 28 year old female presenting today with reports of approximately 2 months of intermittent chest pain that is described as substernal, dull and achy.  This has been accompanied by periods where she is aware of her heartbeat as well as fatigue.  She has also developed shortness of breath on exertion that seems to be worsening.  Sometimes the pain radiates under her breast and around to her shoulder blade although it alternates sides.  No fever, chills, nausea, cough, diaphoresis, arm pain, jaw pain, or neck pain.  Symptoms worse with increased activity however has not found any particular alleviating treatments.  Notes that there are times that she feels chest tightness as if she cannot get a deep breath.  See below for physical exam.  In office EKG showing normal sinus rhythm, normal rate, normal axis without ST elevation or other acute findings.  Plan for labs as below.  After discussion, plan for long-term monitor as well as an echocardiogram for further workup.  Discussed possible other etiologies that could stem from a pulmonary, GI, or musculoskeletal etiology.  Vital signs are stable and she is in no acute distress today.  Strict ER precautions for worsening of symptoms. - DG Chest 2 View; Future - EKG 12-Lead - CBC with Differential/Platelet - CMP14+EGFR - TSH - Hemoglobin A1c - Troponin T - LONG TERM MONITOR (3-14 DAYS); Future - ECHOCARDIOGRAM COMPLETE; Future  Physical Exam Vitals reviewed.  Constitutional:      General: She is not in acute distress.    Appearance: Normal appearance. She is obese. She is not ill-appearing.  HENT:     Head: Normocephalic and atraumatic.  Cardiovascular:     Rate and Rhythm: Normal rate and regular rhythm.     Pulses: Normal pulses.     Heart sounds: Normal heart sounds. No murmur heard.    No friction rub. No gallop.   Pulmonary:     Effort: Pulmonary effort is normal. No respiratory distress.     Breath sounds: Normal breath sounds. No wheezing.  Skin:    General: Skin is warm and dry.  Neurological:     Mental Status: She is alert and oriented to person, place, and time.  Psychiatric:        Mood and Affect: Mood normal.        Behavior: Behavior normal.        Thought Content: Thought content normal.        Judgment: Judgment normal.   Procedures performed this visit: None.  Return if symptoms worsen or fail to improve.  Further follow-up and treatment recommendations pending results.  __________________________________ Maryl Snook, DNP, APRN, FNP-BC Primary Care and Sports Medicine [facility] [place]

## 2024-04-16 NOTE — Progress Notes (Unsigned)
 EP to read.

## 2024-04-17 ENCOUNTER — Ambulatory Visit: Payer: Self-pay | Admitting: Medical-Surgical

## 2024-04-17 DIAGNOSIS — R079 Chest pain, unspecified: Secondary | ICD-10-CM

## 2024-04-17 LAB — CBC WITH DIFFERENTIAL/PLATELET
Basophils Absolute: 0.1 10*3/uL (ref 0.0–0.2)
Basos: 1 %
EOS (ABSOLUTE): 0.1 10*3/uL (ref 0.0–0.4)
Eos: 1 %
Hematocrit: 40.5 % (ref 34.0–46.6)
Hemoglobin: 12.9 g/dL (ref 11.1–15.9)
Immature Grans (Abs): 0.1 10*3/uL (ref 0.0–0.1)
Immature Granulocytes: 1 %
Lymphocytes Absolute: 2.9 10*3/uL (ref 0.7–3.1)
Lymphs: 32 %
MCH: 26.5 pg — ABNORMAL LOW (ref 26.6–33.0)
MCHC: 31.9 g/dL (ref 31.5–35.7)
MCV: 83 fL (ref 79–97)
Monocytes Absolute: 0.6 10*3/uL (ref 0.1–0.9)
Monocytes: 7 %
Neutrophils Absolute: 5.4 10*3/uL (ref 1.4–7.0)
Neutrophils: 58 %
Platelets: 327 10*3/uL (ref 150–450)
RBC: 4.87 x10E6/uL (ref 3.77–5.28)
RDW: 14.8 % (ref 11.7–15.4)
WBC: 9.2 10*3/uL (ref 3.4–10.8)

## 2024-04-17 LAB — CMP14+EGFR
ALT: 15 IU/L (ref 0–32)
AST: 13 IU/L (ref 0–40)
Albumin: 4.2 g/dL (ref 4.0–5.0)
Alkaline Phosphatase: 93 IU/L (ref 44–121)
BUN/Creatinine Ratio: 18 (ref 9–23)
BUN: 13 mg/dL (ref 6–20)
Bilirubin Total: <0.2 mg/dL (ref 0.0–1.2)
CO2: 21 mmol/L (ref 20–29)
Calcium: 9.7 mg/dL (ref 8.7–10.2)
Chloride: 101 mmol/L (ref 96–106)
Creatinine, Ser: 0.72 mg/dL (ref 0.57–1.00)
Globulin, Total: 2.5 g/dL (ref 1.5–4.5)
Glucose: 100 mg/dL — ABNORMAL HIGH (ref 70–99)
Potassium: 4.5 mmol/L (ref 3.5–5.2)
Sodium: 137 mmol/L (ref 134–144)
Total Protein: 6.7 g/dL (ref 6.0–8.5)
eGFR: 117 mL/min/{1.73_m2} (ref >59)

## 2024-04-17 LAB — TROPONIN T: Troponin T (Highly Sensitive): <6 ng/L (ref 0–14)

## 2024-04-17 LAB — HEMOGLOBIN A1C
Est. average glucose Bld gHb Est-mCnc: 126 mg/dL
Hgb A1c MFr Bld: 6 % — ABNORMAL HIGH (ref 4.8–5.6)

## 2024-04-17 LAB — TSH: TSH: 1.49 u[IU]/mL (ref 0.450–4.500)

## 2024-05-12 DIAGNOSIS — R079 Chest pain, unspecified: Secondary | ICD-10-CM

## 2024-05-14 ENCOUNTER — Ambulatory Visit (HOSPITAL_COMMUNITY)
Admission: RE | Admit: 2024-05-14 | Discharge: 2024-05-14 | Disposition: A | Discharge status: Home / Self Care | Source: Ambulatory Visit | Attending: Cardiology | Admitting: Cardiology

## 2024-05-14 DIAGNOSIS — R079 Chest pain, unspecified: Secondary | ICD-10-CM | POA: Insufficient documentation

## 2024-05-14 LAB — ECHOCARDIOGRAM COMPLETE
Area-P 1/2: 4.68 cm2
S' Lateral: 2.8 cm

## 2024-05-14 MED ORDER — PERFLUTREN LIPID MICROSPHERE
1.0000 mL | INTRAVENOUS | Status: AC | PRN
  Administered 2024-05-14: 3 mL via INTRAVENOUS

## 2024-06-03 ENCOUNTER — Other Ambulatory Visit: Payer: Self-pay

## 2024-06-03 MED ORDER — SERTRALINE HCL 100 MG PO TABS: 150.0000 mg | ORAL_TABLET | Freq: Every day | ORAL | 0 refills | Status: DC

## 2024-06-21 ENCOUNTER — Encounter: Payer: Self-pay | Admitting: Medical-Surgical

## 2024-06-21 ENCOUNTER — Ambulatory Visit (INDEPENDENT_AMBULATORY_CARE_PROVIDER_SITE_OTHER): Payer: Managed Care, Other (non HMO) | Admitting: Medical-Surgical

## 2024-06-21 ENCOUNTER — Other Ambulatory Visit (HOSPITAL_COMMUNITY)
Admission: RE | Admit: 2024-06-21 | Discharge: 2024-06-21 | Disposition: A | Discharge status: Home / Self Care | Source: Ambulatory Visit | Attending: Medical-Surgical | Admitting: Medical-Surgical

## 2024-06-21 VITALS — BP 108/72 | HR 99 | Resp 20 | Ht 65.0 in | Wt 299.0 lb

## 2024-06-21 DIAGNOSIS — Z113 Encounter for screening for infections with a predominantly sexual mode of transmission: Secondary | ICD-10-CM

## 2024-06-21 DIAGNOSIS — R7303 Prediabetes: Secondary | ICD-10-CM | POA: Insufficient documentation

## 2024-06-21 DIAGNOSIS — Z0000 Encounter for general adult medical examination without abnormal findings: Secondary | ICD-10-CM | POA: Diagnosis not present

## 2024-06-21 DIAGNOSIS — Z124 Encounter for screening for malignant neoplasm of cervix: Secondary | ICD-10-CM

## 2024-06-21 DIAGNOSIS — K219 Gastro-esophageal reflux disease without esophagitis: Secondary | ICD-10-CM

## 2024-06-21 LAB — HM PAP SMEAR: HPV, high-risk: NEGATIVE

## 2024-06-21 NOTE — Patient Instructions (Signed)

## 2024-06-21 NOTE — Progress Notes (Signed)
 Complete physical exam  Patient: Sheryl [name]   DOB: 07-31-1996   27 y.o. Female  MRN: [id number]  Subjective:    Chief Complaint  Patient presents with   Annual Exam   Gynecologic Exam    Sheryl [name] is a 28 y.o. female who presents today for a complete physical exam. She reports consuming a general diet. [employer] twice weekly, walking at lunch, etc during exercise.  She generally feels well. She reports sleeping well. She does not have additional problems to discuss today.    Most recent fall risk assessment:    06/09/2023    8:45 AM  Fall Risk   Falls in the past year? 0  Number falls in past yr: 0  Injury with Fall? 0  Risk for fall due to : No Fall Risks  Follow up Falls evaluation completed     Most recent depression screenings:    06/09/2023    9:18 AM 06/09/2023    8:45 AM  PHQ 2/9 Scores  PHQ - 2 Score 1 1  PHQ- 9 Score 5     Vision:Within last year, Dental: No current dental problems and Receives regular dental care, and STD: The patient denies history of sexually transmitted disease.    Patient Care Team: Willo Mini, NP as PCP - General (Nurse Practitioner)   Outpatient Medications Prior to Visit  Medication Sig   Clindamycin-Benzoyl Per, Refr, gel Apply topically.   sertraline  (ZOLOFT ) 100 MG tablet Take 1.5 tablets (150 mg total) by mouth daily.   tretinoin (RETIN-A) 0.025 % cream Apply pea-sized amount to face nightly as tolerated. If irritating, decrease frequency of use.   WEGOVY  2.4 MG/0.75ML SOAJ SQ injection Inject 2.4 mg into the skin.   [DISCONTINUED] WEGOVY  0.25 MG/0.5ML SOAJ Inject 0.25 mg into the skin once a week. Use this dose for 1 month (4 shots) and then increase to next higher dose.   No facility-administered medications prior to visit.    Review of Systems  Constitutional:  Negative for chills, fever, malaise/fatigue and weight loss.  HENT:  Negative for congestion, ear pain, hearing loss, sinus pain and sore throat.   Eyes:   Negative for blurred vision, photophobia and pain.  Respiratory:  Negative for cough, shortness of breath and wheezing.   Cardiovascular:  Negative for chest pain, palpitations and leg swelling.  Gastrointestinal:  Negative for abdominal pain, constipation, diarrhea, heartburn, nausea and vomiting.  Genitourinary:  Negative for dysuria, frequency and urgency.  Musculoskeletal:  Negative for falls and neck pain.  Skin:  Negative for itching and rash.  Neurological:  Negative for dizziness, weakness and headaches.  Endo/Heme/Allergies:  Negative for polydipsia. Does not bruise/bleed easily.  Psychiatric/Behavioral:  Negative for depression, substance abuse and suicidal ideas. The patient is not nervous/anxious.      Objective:     BP 108/72 (BP Location: Right Arm, Cuff Size: Large)   Pulse 99   Resp 20   Ht 5' 5 (1.651 m)   Wt 299 lb (135.6 kg)   SpO2 97%   BMI 49.76 kg/m    Physical Exam Vitals reviewed. Exam conducted with a chaperone present.  Constitutional:      General: She is not in acute distress.    Appearance: Normal appearance. She is obese. She is not ill-appearing.  HENT:     Head: Normocephalic and atraumatic.     Right Ear: Tympanic membrane, ear canal and external ear normal. There is no impacted cerumen.  Left Ear: Tympanic membrane, ear canal and external ear normal. There is no impacted cerumen.     Nose: Nose normal. No congestion or rhinorrhea.     Mouth/Throat:     Mouth: Mucous membranes are moist.     Pharynx: No oropharyngeal exudate or posterior oropharyngeal erythema.  Eyes:     General: No scleral icterus.       Right eye: No discharge.        Left eye: No discharge.     Extraocular Movements: Extraocular movements intact.     Conjunctiva/sclera: Conjunctivae normal.     Pupils: Pupils are equal, round, and reactive to light.  Neck:     Thyroid : No thyromegaly.     Vascular: No carotid bruit or JVD.     Trachea: Trachea normal.   Cardiovascular:     Rate and Rhythm: Normal rate and regular rhythm.     Pulses: Normal pulses.     Heart sounds: Normal heart sounds. No murmur heard.    No friction rub. No gallop.  Pulmonary:     Effort: Pulmonary effort is normal. No respiratory distress.     Breath sounds: Normal breath sounds. No wheezing.  Abdominal:     General: Bowel sounds are normal. There is no distension.     Palpations: Abdomen is soft.     Tenderness: There is no abdominal tenderness. There is no guarding.  Genitourinary:    General: Normal vulva.     Exam position: Lithotomy position.     Labia:        Right: No rash, tenderness, lesion or injury.        Left: No rash, tenderness, lesion or injury.      Vagina: Normal.     Cervix: Normal.     Uterus: Normal.      Adnexa: Right adnexa normal and left adnexa normal.  Musculoskeletal:        General: Normal range of motion.     Cervical back: Normal range of motion and neck supple.  Lymphadenopathy:     Cervical: No cervical adenopathy.  Skin:    General: Skin is warm and dry.  Neurological:     Mental Status: She is alert and oriented to person, place, and time.     Cranial Nerves: No cranial nerve deficit.  Psychiatric:        Mood and Affect: Mood normal.        Behavior: Behavior normal.        Thought Content: Thought content normal.        Judgment: Judgment normal.      No results found for any visits on 06/21/24.     Assessment & Plan:    Routine Health Maintenance and Physical Exam  Immunization History  Administered Date(s) Administered   DTaP 10/19/1995, 12/26/1995, 02/19/1996, 03/05/1997, 06/13/2000   HIB (PRP-OMP) 10/19/1995, 12/26/1995, 02/19/1996, 03/05/1997   HPV 9-valent 04/09/2021, 05/07/2021, 10/22/2021   Hepatitis A, Ped/Adol-2 Dose 02/06/2013   Hepatitis B, PED/ADOLESCENT 05-Jul-1995, 10/19/1995, 05/21/1996   IPV 10/19/1995, 12/26/1995, 02/19/1996, 06/13/2000   Influenza-Unspecified 08/06/2020, 07/08/2021,  08/15/2022   MMR 12/12/1996, 06/13/2000   Meningococcal B Recombinant 05/22/2015   Meningococcal Conjugate 02/06/2013   PFIZER(Purple Top)SARS-COV-2 Vaccination 11/20/2019, 12/18/2019   Pneumococcal-Unspecified 09/06/1999   Tdap 08/20/2009, 06/21/2016   Varicella 08/27/1996    Health Maintenance  Topic Date Due   HIV Screening  Never done   Cervical Cancer Screening (Pap smear)  03/05/2024   INFLUENZA VACCINE  02/04/2025 (  Originally 06/07/2024)   DTaP/Tdap/Td (8 - Td or Tdap) 06/21/2026   Hepatitis B Vaccines 19-59 Average Risk  Completed   HPV VACCINES  Completed   Pneumococcal Vaccine  Aged Out   Meningococcal B Vaccine  Aged Out   COVID-19 Vaccine  Discontinued   Hepatitis C Screening  Discontinued    Discussed health benefits of physical activity, and encouraged her to engage in regular exercise appropriate for her age and condition.  1. Annual physical exam (Primary) Checking labs as below. UTD on preventative care. Wellness information provided with AVS. - CBC with Differential/Platelet - CMP14+EGFR  2. Morbid obesity (HCC) Checking labs. Continue working with [facility].  - TSH - Lipid panel  3. Prediabetes Recent A1c 6.0%, on Wegovy .   4. Cervical cancer screening Pap smear with STI testing today per patient.  - Cytology - PAP  5. Gastroesophageal reflux disease, unspecified whether esophagitis present Working on dietary modification and weight loss for management.   6. Routine screening for STI (sexually transmitted infection) Pap smear with G/C, HPV, and trichomonas added. Adding blood testing for STI's for patient baseline.  - Cytology - PAP - STI Profile  Return in about 1 year (around 06/21/2025) for annual physical exam or sooner if needed.     Tandre Conly, NP

## 2024-06-22 LAB — CBC WITH DIFFERENTIAL/PLATELET
Basophils Absolute: 0.1 x10E3/uL (ref 0.0–0.2)
Basos: 1 %
EOS (ABSOLUTE): 0.1 x10E3/uL (ref 0.0–0.4)
Eos: 1 %
Hematocrit: 42.3 % (ref 34.0–46.6)
Hemoglobin: 13.2 g/dL (ref 11.1–15.9)
Immature Grans (Abs): 0.1 x10E3/uL (ref 0.0–0.1)
Immature Granulocytes: 1 %
Lymphocytes Absolute: 2.4 x10E3/uL (ref 0.7–3.1)
Lymphs: 26 %
MCH: 26.7 pg (ref 26.6–33.0)
MCHC: 31.2 g/dL — ABNORMAL LOW (ref 31.5–35.7)
MCV: 86 fL (ref 79–97)
Monocytes Absolute: 0.7 x10E3/uL (ref 0.1–0.9)
Monocytes: 7 %
Neutrophils Absolute: 6.2 x10E3/uL (ref 1.4–7.0)
Neutrophils: 64 %
Platelets: 358 x10E3/uL (ref 150–450)
RBC: 4.95 x10E6/uL (ref 3.77–5.28)
RDW: 14.6 % (ref 11.7–15.4)
WBC: 9.5 x10E3/uL (ref 3.4–10.8)

## 2024-06-22 LAB — STI PROFILE
HCV Ab: NONREACTIVE
HIV Screen 4th Generation wRfx: NONREACTIVE
Hep B Core Total Ab: NEGATIVE
Hep B Surface Ab, Qual: NONREACTIVE
Hepatitis B Surface Ag: NEGATIVE
RPR Ser Ql: NONREACTIVE

## 2024-06-22 LAB — CMP14+EGFR
ALT: 18 IU/L (ref 0–32)
AST: 16 IU/L (ref 0–40)
Albumin: 4.3 g/dL (ref 4.0–5.0)
Alkaline Phosphatase: 86 IU/L (ref 44–121)
BUN/Creatinine Ratio: 18 (ref 9–23)
BUN: 13 mg/dL (ref 6–20)
Bilirubin Total: 0.2 mg/dL (ref 0.0–1.2)
CO2: 21 mmol/L (ref 20–29)
Calcium: 9.3 mg/dL (ref 8.7–10.2)
Chloride: 99 mmol/L (ref 96–106)
Creatinine, Ser: 0.74 mg/dL (ref 0.57–1.00)
Globulin, Total: 2.3 g/dL (ref 1.5–4.5)
Glucose: 94 mg/dL (ref 70–99)
Potassium: 4.6 mmol/L (ref 3.5–5.2)
Sodium: 135 mmol/L (ref 134–144)
Total Protein: 6.6 g/dL (ref 6.0–8.5)
eGFR: 113 mL/min/1.73 (ref >59)

## 2024-06-22 LAB — LIPID PANEL
Chol/HDL Ratio: 5.6 ratio — ABNORMAL HIGH (ref 0.0–4.4)
Cholesterol, Total: 209 mg/dL — ABNORMAL HIGH (ref 100–199)
HDL: 37 mg/dL — ABNORMAL LOW (ref >39)
LDL Chol Calc (NIH): 142 mg/dL — ABNORMAL HIGH (ref 0–99)
Triglycerides: 164 mg/dL — ABNORMAL HIGH (ref 0–149)
VLDL Cholesterol Cal: 30 mg/dL (ref 5–40)

## 2024-06-22 LAB — HCV INTERPRETATION

## 2024-06-22 LAB — TSH: TSH: 1.64 u[IU]/mL (ref 0.450–4.500)

## 2024-06-24 ENCOUNTER — Ambulatory Visit: Payer: Self-pay | Admitting: Medical-Surgical

## 2024-06-26 LAB — CYTOLOGY - PAP
Chlamydia: NEGATIVE
Comment: NEGATIVE
Comment: NEGATIVE
Comment: NEGATIVE
Comment: NORMAL
Diagnosis: NEGATIVE
High risk HPV: NEGATIVE
Neisseria Gonorrhea: NEGATIVE
Trichomonas: NEGATIVE

## 2024-06-27 ENCOUNTER — Encounter: Payer: Self-pay | Admitting: Medical-Surgical

## 2024-07-11 ENCOUNTER — Other Ambulatory Visit: Payer: Self-pay | Admitting: Medical-Surgical

## 2024-11-04 ENCOUNTER — Telehealth: Admitting: Physician Assistant

## 2024-11-04 ENCOUNTER — Telehealth: Payer: Self-pay

## 2024-11-04 DIAGNOSIS — J069 Acute upper respiratory infection, unspecified: Secondary | ICD-10-CM

## 2024-11-04 MED ORDER — FLUTICASONE PROPIONATE 50 MCG/ACT NA SUSP: 2.0000 | Freq: Every day | NASAL | 0 refills | Status: AC

## 2024-11-04 MED ORDER — PSEUDOEPH-BROMPHEN-DM 30-2-10 MG/5ML PO SYRP: 5.0000 mL | ORAL_SOLUTION | Freq: Four times a day (QID) | ORAL | 0 refills | Status: AC | PRN

## 2024-11-04 NOTE — Progress Notes (Signed)

## 2024-11-04 NOTE — Telephone Encounter (Signed)
 Copied from CRM #[id number]. Topic: Clinical - Prescription Issue >> Nov 04, 2024 12:33 PM Larissa S wrote: Reason for CRM: Patient states her pharmacy informed that the following medications are needing approval. Patient is requesting to have this done as soon as possible, so that she may return to work.   fluticasone  (FLONASE ) 50 MCG/ACT nasal spray brompheniramine-pseudoephedrine-DM 30-2-10 MG/5ML syrup

## 2025-06-26 ENCOUNTER — Encounter: Admitting: Medical-Surgical
